# Patient Record
Sex: Male | Born: 1965 | Hispanic: No | Marital: Married | Smoking: Former smoker
Health system: Southern US, Community
[De-identification: ages and names within clinical notes are randomized; demographics above are authoritative.]

## PROBLEM LIST (undated history)

## (undated) DIAGNOSIS — K279 Peptic ulcer, site unspecified, unspecified as acute or chronic, without hemorrhage or perforation: Secondary | ICD-10-CM

## (undated) DIAGNOSIS — L600 Ingrowing nail: Secondary | ICD-10-CM

## (undated) DIAGNOSIS — B9681 Helicobacter pylori [H. pylori] as the cause of diseases classified elsewhere: Secondary | ICD-10-CM

## (undated) DIAGNOSIS — S335XXA Sprain of ligaments of lumbar spine, initial encounter: Secondary | ICD-10-CM

## (undated) DIAGNOSIS — K219 Gastro-esophageal reflux disease without esophagitis: Secondary | ICD-10-CM

## (undated) DIAGNOSIS — S42009A Fracture of unspecified part of unspecified clavicle, initial encounter for closed fracture: Secondary | ICD-10-CM

## (undated) DIAGNOSIS — R42 Dizziness and giddiness: Secondary | ICD-10-CM

## (undated) HISTORY — DX: Ingrowing nail: L60.0

## (undated) HISTORY — DX: Helicobacter pylori (H. pylori) as the cause of diseases classified elsewhere: B96.81

## (undated) HISTORY — DX: Sprain of ligaments of lumbar spine, initial encounter: S33.5XXA

## (undated) HISTORY — DX: Gastro-esophageal reflux disease without esophagitis: K21.9

## (undated) HISTORY — DX: Dizziness and giddiness: R42

## (undated) HISTORY — DX: Fracture of unspecified part of unspecified clavicle, initial encounter for closed fracture: S42.009A

## (undated) HISTORY — DX: Peptic ulcer, site unspecified, unspecified as acute or chronic, without hemorrhage or perforation: K27.9

---

## 1999-06-14 ENCOUNTER — Encounter: Admission: RE | Admit: 1999-06-14 | Discharge: 1999-06-14 | Payer: Self-pay | Admitting: Family Medicine

## 1999-06-20 ENCOUNTER — Encounter: Admission: RE | Admit: 1999-06-20 | Discharge: 1999-06-20 | Payer: Self-pay | Admitting: Family Medicine

## 1999-07-16 ENCOUNTER — Encounter: Admission: RE | Admit: 1999-07-16 | Discharge: 1999-07-16 | Payer: Self-pay | Admitting: Sports Medicine

## 2000-08-25 ENCOUNTER — Encounter: Admission: RE | Admit: 2000-08-25 | Discharge: 2000-08-25 | Payer: Self-pay | Admitting: Family Medicine

## 2000-09-15 ENCOUNTER — Encounter: Admission: RE | Admit: 2000-09-15 | Discharge: 2000-09-15 | Payer: Self-pay | Admitting: Family Medicine

## 2000-10-07 ENCOUNTER — Encounter: Admission: RE | Admit: 2000-10-07 | Discharge: 2000-10-07 | Payer: Self-pay | Admitting: *Deleted

## 2000-10-07 ENCOUNTER — Encounter: Admission: RE | Admit: 2000-10-07 | Discharge: 2000-10-07 | Payer: Self-pay | Admitting: Family Medicine

## 2000-10-14 ENCOUNTER — Encounter: Admission: RE | Admit: 2000-10-14 | Discharge: 2000-10-14 | Payer: Self-pay | Admitting: Family Medicine

## 2001-01-03 ENCOUNTER — Emergency Department (HOSPITAL_COMMUNITY): Admission: EM | Admit: 2001-01-03 | Discharge: 2001-01-03 | Payer: Self-pay

## 2001-01-14 ENCOUNTER — Encounter: Admission: RE | Admit: 2001-01-14 | Discharge: 2001-01-14 | Payer: Self-pay | Admitting: Family Medicine

## 2001-01-19 ENCOUNTER — Encounter: Admission: RE | Admit: 2001-01-19 | Discharge: 2001-02-19 | Payer: Self-pay | Admitting: Sports Medicine

## 2001-01-27 ENCOUNTER — Encounter: Admission: RE | Admit: 2001-01-27 | Discharge: 2001-01-27 | Payer: Self-pay | Admitting: Family Medicine

## 2001-02-19 ENCOUNTER — Encounter: Admission: RE | Admit: 2001-02-19 | Discharge: 2001-02-19 | Payer: Self-pay | Admitting: Family Medicine

## 2001-05-25 ENCOUNTER — Encounter: Admission: RE | Admit: 2001-05-25 | Discharge: 2001-05-25 | Payer: Self-pay | Admitting: Family Medicine

## 2001-05-28 ENCOUNTER — Encounter: Admission: RE | Admit: 2001-05-28 | Discharge: 2001-05-28 | Payer: Self-pay | Admitting: Sports Medicine

## 2001-05-28 ENCOUNTER — Encounter: Payer: Self-pay | Admitting: Sports Medicine

## 2001-07-20 ENCOUNTER — Encounter: Payer: Self-pay | Admitting: Family Medicine

## 2001-07-20 ENCOUNTER — Ambulatory Visit (HOSPITAL_COMMUNITY): Admission: RE | Admit: 2001-07-20 | Discharge: 2001-07-20 | Payer: Self-pay | Admitting: Family Medicine

## 2001-07-20 ENCOUNTER — Encounter: Admission: RE | Admit: 2001-07-20 | Discharge: 2001-07-20 | Payer: Self-pay | Admitting: Family Medicine

## 2001-08-10 ENCOUNTER — Encounter: Admission: RE | Admit: 2001-08-10 | Discharge: 2001-08-10 | Payer: Self-pay | Admitting: Family Medicine

## 2001-11-14 ENCOUNTER — Encounter: Payer: Self-pay | Admitting: Emergency Medicine

## 2001-11-14 ENCOUNTER — Emergency Department (HOSPITAL_COMMUNITY): Admission: EM | Admit: 2001-11-14 | Discharge: 2001-11-14 | Payer: Self-pay | Admitting: Emergency Medicine

## 2001-12-01 DIAGNOSIS — S42009A Fracture of unspecified part of unspecified clavicle, initial encounter for closed fracture: Secondary | ICD-10-CM

## 2001-12-01 HISTORY — DX: Fracture of unspecified part of unspecified clavicle, initial encounter for closed fracture: S42.009A

## 2001-12-14 ENCOUNTER — Encounter: Admission: RE | Admit: 2001-12-14 | Discharge: 2002-03-14 | Payer: Self-pay | Admitting: Sports Medicine

## 2002-02-02 ENCOUNTER — Encounter: Admission: RE | Admit: 2002-02-02 | Discharge: 2002-02-02 | Payer: Self-pay | Admitting: Family Medicine

## 2002-02-16 ENCOUNTER — Encounter: Admission: RE | Admit: 2002-02-16 | Discharge: 2002-02-16 | Payer: Self-pay | Admitting: Family Medicine

## 2002-05-12 ENCOUNTER — Encounter: Admission: RE | Admit: 2002-05-12 | Discharge: 2002-05-12 | Payer: Self-pay | Admitting: Family Medicine

## 2002-11-27 ENCOUNTER — Emergency Department (HOSPITAL_COMMUNITY): Admission: EM | Admit: 2002-11-27 | Discharge: 2002-11-27 | Payer: Self-pay | Admitting: Emergency Medicine

## 2003-02-16 ENCOUNTER — Encounter: Admission: RE | Admit: 2003-02-16 | Discharge: 2003-02-16 | Payer: Self-pay | Admitting: Family Medicine

## 2003-12-15 ENCOUNTER — Encounter: Admission: RE | Admit: 2003-12-15 | Discharge: 2003-12-15 | Payer: Self-pay | Admitting: Family Medicine

## 2007-01-28 DIAGNOSIS — N400 Enlarged prostate without lower urinary tract symptoms: Secondary | ICD-10-CM | POA: Insufficient documentation

## 2007-04-09 ENCOUNTER — Ambulatory Visit: Payer: Self-pay | Admitting: Family Medicine

## 2007-04-09 ENCOUNTER — Encounter (INDEPENDENT_AMBULATORY_CARE_PROVIDER_SITE_OTHER): Payer: Self-pay | Admitting: Family Medicine

## 2007-04-09 DIAGNOSIS — L600 Ingrowing nail: Secondary | ICD-10-CM | POA: Insufficient documentation

## 2007-04-09 HISTORY — DX: Ingrowing nail: L60.0

## 2008-04-06 ENCOUNTER — Ambulatory Visit: Payer: Self-pay | Admitting: Family Medicine

## 2008-04-06 ENCOUNTER — Encounter (INDEPENDENT_AMBULATORY_CARE_PROVIDER_SITE_OTHER): Payer: Self-pay | Admitting: Family Medicine

## 2008-04-06 DIAGNOSIS — N400 Enlarged prostate without lower urinary tract symptoms: Secondary | ICD-10-CM

## 2008-04-06 DIAGNOSIS — K5900 Constipation, unspecified: Secondary | ICD-10-CM | POA: Insufficient documentation

## 2008-04-06 DIAGNOSIS — R3919 Other difficulties with micturition: Secondary | ICD-10-CM

## 2008-04-06 DIAGNOSIS — K5901 Slow transit constipation: Secondary | ICD-10-CM

## 2008-04-06 LAB — CONVERTED CEMR LAB
BUN: 12 mg/dL (ref 6–23)
Bilirubin Urine: NEGATIVE
CO2: 26 meq/L (ref 19–32)
Calcium: 9 mg/dL (ref 8.4–10.5)
Chloride: 103 meq/L (ref 96–112)
Creatinine, Ser: 0.89 mg/dL (ref 0.40–1.50)
Glucose, Bld: 136 mg/dL — ABNORMAL HIGH (ref 70–99)
Glucose, Urine, Semiquant: NEGATIVE
Ketones, urine, test strip: NEGATIVE
Nitrite: NEGATIVE
PSA: 0.8 ng/mL (ref 0.10–4.00)
Potassium: 4 meq/L (ref 3.5–5.3)
Protein, U semiquant: NEGATIVE
Sodium: 141 meq/L (ref 135–145)
Specific Gravity, Urine: 1.025
TSH: 3.168 microintl units/mL (ref 0.350–5.50)
Urobilinogen, UA: 0.2
WBC Urine, dipstick: NEGATIVE
pH: 5.5

## 2008-04-13 ENCOUNTER — Ambulatory Visit: Payer: Self-pay | Admitting: Family Medicine

## 2008-04-20 ENCOUNTER — Ambulatory Visit: Payer: Self-pay | Admitting: Family Medicine

## 2008-04-20 ENCOUNTER — Encounter (INDEPENDENT_AMBULATORY_CARE_PROVIDER_SITE_OTHER): Payer: Self-pay | Admitting: Family Medicine

## 2008-04-23 ENCOUNTER — Encounter (INDEPENDENT_AMBULATORY_CARE_PROVIDER_SITE_OTHER): Payer: Self-pay | Admitting: Family Medicine

## 2008-04-23 LAB — CONVERTED CEMR LAB
ALT: 46 units/L (ref 0–53)
Albumin: 4.2 g/dL (ref 3.5–5.2)
Bilirubin, Direct: 0.1 mg/dL (ref 0.0–0.3)
Cholesterol: 214 mg/dL — ABNORMAL HIGH (ref 0–200)
Total CHOL/HDL Ratio: 5.6
VLDL: 20 mg/dL (ref 0–40)

## 2008-12-27 ENCOUNTER — Telehealth (INDEPENDENT_AMBULATORY_CARE_PROVIDER_SITE_OTHER): Payer: Self-pay | Admitting: Family Medicine

## 2009-02-16 ENCOUNTER — Ambulatory Visit: Payer: Self-pay | Admitting: Family Medicine

## 2009-02-16 DIAGNOSIS — R5381 Other malaise: Secondary | ICD-10-CM

## 2009-02-16 DIAGNOSIS — R5383 Other fatigue: Secondary | ICD-10-CM

## 2009-02-16 DIAGNOSIS — S335XXA Sprain of ligaments of lumbar spine, initial encounter: Secondary | ICD-10-CM

## 2009-02-16 HISTORY — DX: Sprain of ligaments of lumbar spine, initial encounter: S33.5XXA

## 2009-02-19 ENCOUNTER — Telehealth: Payer: Self-pay | Admitting: *Deleted

## 2009-02-27 ENCOUNTER — Encounter: Admission: RE | Admit: 2009-02-27 | Discharge: 2009-03-28 | Payer: Self-pay | Admitting: Family Medicine

## 2009-03-29 ENCOUNTER — Encounter (INDEPENDENT_AMBULATORY_CARE_PROVIDER_SITE_OTHER): Payer: Self-pay | Admitting: Family Medicine

## 2010-04-16 ENCOUNTER — Telehealth: Payer: Self-pay | Admitting: Family Medicine

## 2010-04-16 ENCOUNTER — Encounter: Payer: Self-pay | Admitting: Family Medicine

## 2010-04-16 ENCOUNTER — Ambulatory Visit: Payer: Self-pay | Admitting: Family Medicine

## 2010-04-16 DIAGNOSIS — R300 Dysuria: Secondary | ICD-10-CM

## 2010-04-16 LAB — CONVERTED CEMR LAB
Bilirubin Urine: NEGATIVE
Glucose, Urine, Semiquant: NEGATIVE
PSA: 1.08 ng/mL (ref 0.10–4.00)
Protein, U semiquant: NEGATIVE
Specific Gravity, Urine: 1.01
pH: 5.5

## 2010-04-22 ENCOUNTER — Telehealth: Payer: Self-pay | Admitting: Family Medicine

## 2010-07-23 ENCOUNTER — Encounter: Payer: Self-pay | Admitting: Family Medicine

## 2010-07-23 ENCOUNTER — Ambulatory Visit: Payer: Self-pay | Admitting: Family Medicine

## 2010-08-08 ENCOUNTER — Encounter: Payer: Self-pay | Admitting: Family Medicine

## 2010-08-08 ENCOUNTER — Ambulatory Visit: Payer: Self-pay | Admitting: Family Medicine

## 2010-08-08 DIAGNOSIS — R42 Dizziness and giddiness: Secondary | ICD-10-CM | POA: Insufficient documentation

## 2010-08-08 DIAGNOSIS — E7800 Pure hypercholesterolemia, unspecified: Secondary | ICD-10-CM

## 2010-08-08 LAB — CONVERTED CEMR LAB
ALT: 178 units/L — ABNORMAL HIGH (ref 0–53)
AST: 84 units/L — ABNORMAL HIGH (ref 0–37)
Albumin: 4.4 g/dL (ref 3.5–5.2)
Alkaline Phosphatase: 66 units/L (ref 39–117)
Calcium: 9 mg/dL (ref 8.4–10.5)
Chloride: 103 meq/L (ref 96–112)
Platelets: 196 10*3/uL (ref 150–400)
Potassium: 4.1 meq/L (ref 3.5–5.3)
RDW: 12.4 % (ref 11.5–15.5)
Sodium: 139 meq/L (ref 135–145)

## 2010-08-27 ENCOUNTER — Ambulatory Visit: Payer: Self-pay | Admitting: Family Medicine

## 2010-09-09 ENCOUNTER — Ambulatory Visit: Payer: Self-pay | Admitting: Family Medicine

## 2010-09-09 ENCOUNTER — Encounter: Payer: Self-pay | Admitting: Family Medicine

## 2010-09-09 DIAGNOSIS — R10817 Generalized abdominal tenderness: Secondary | ICD-10-CM | POA: Insufficient documentation

## 2010-09-09 LAB — CONVERTED CEMR LAB
Albumin: 4.4 g/dL (ref 3.5–5.2)
Alkaline Phosphatase: 58 units/L (ref 39–117)
BUN: 10 mg/dL (ref 6–23)
CO2: 30 meq/L (ref 19–32)
Glucose, Bld: 98 mg/dL (ref 70–99)
Nitrite: NEGATIVE
Potassium: 3.9 meq/L (ref 3.5–5.3)
Protein, U semiquant: NEGATIVE
Specific Gravity, Urine: 1.02
Total Bilirubin: 0.6 mg/dL (ref 0.3–1.2)
Total Protein: 7.1 g/dL (ref 6.0–8.3)
Urobilinogen, UA: 0.2
pH: 6

## 2010-09-13 ENCOUNTER — Encounter: Payer: Self-pay | Admitting: Family Medicine

## 2010-09-13 ENCOUNTER — Ambulatory Visit: Payer: Self-pay | Admitting: Family Medicine

## 2011-01-02 NOTE — Assessment & Plan Note (Signed)
Summary: pain with urination/Michael Fowler   Vital Signs:  Patient profile:   45 year old male Height:      70 inches Weight:      159.9 pounds BMI:     23.03 Temp:     98.1 degrees F oral Pulse rate:   51 / minute BP sitting:   135 / 82  (left arm) Cuff size:   regular  Vitals Entered By: Garen Grams LPN (Apr 16, 2010 2:51 PM) CC: dysuria x 2 weeks Is Patient Diabetic? No Pain Assessment Patient in pain? no        Primary Care Provider:  Alvia Grove DO  CC:  dysuria x 2 weeks.  History of Present Illness: Pt states that he is having some dyysuria for the last ten days.  Pt has hx of BPH states that it feels a little like that at the beginning.  Pt began taking his flomax again after some time off that was less than a month and today it seems to be improving.  Pt states the pain seems to be more at the meatus, no discharge no blood, strong stream at first but then seems to decrease to a slow dribble by the end.  Pt states that he has been a little uncomfortable as well with defecationi of late stating that it seems to be causing (perineum) pain.  Pt states he is sexually active with his wife but denies any other partners or sexually transmitted diseases.  Pt does not remember when his prostate was checked last.   No recent travel hx, no fever, chills, nausea, vomiting, diarrhea but maybe a little constipation.   Habits & Providers  Alcohol-Tobacco-Diet     Tobacco Status: never  Current Medications (verified): 1)  Flomax 0.4 Mg  Cp24 (Tamsulosin Hcl) .... One Tablet Daily For Benign Prostatic Hypertrophy 2)  Benefiber   Powd (Wheat Dextrin) .... Take As Directed To Increase Fiber Intake.  Disp 1 Tub 3)  Miralax   Powd (Polyethylene Glycol 3350) .Marland Kitchen.. 17g Daily Up To Twice Daily For Constipation.  Disp 170g 4)  Naprosyn 500 Mg Tabs (Naproxen) .Marland Kitchen.. 1 Two Times A Day As Needed Pain 5)  Cyclobenzaprine Hcl 10 Mg  Tabs (Cyclobenzaprine Hcl) .... 1/2 To 1 Tablet By Mouth 2 Times Daily As  Needed For Back Pain. Caution - Can Make You Sleepy 6)  Ciprofloxacin Hcl 500 Mg Tabs (Ciprofloxacin Hcl) .... Take 1 Tab By Mouth Two Times A Day For Three Days  Allergies (verified): No Known Drug Allergies  Past History:  Past medical, surgical, family and social histories (including risk factors) reviewed, and no changes noted (except as noted below).  Past Medical History: Reviewed history from 04/13/2008 and no changes required. single testicle BPH - PSA 0.07 Apr 2008  Past Surgical History: Reviewed history from 04/13/2008 and no changes required. none  Family History: Reviewed history from 04/13/2008 and no changes required. father - died at 64 and healthy mother - healthy in her 72s brothers and sisters   Social History: Reviewed history from 04/13/2008 and no changes required. Somali.  Lives  with wife and 6 children. Works at [employer].  Muslim.  Prior tobacco abuse.  Review of Systems       denies fever, chills, nausea, vomiting, diarrhea   Physical Exam  General:  Well-developed,well-nourished,in no acute distress; alert,appropriate and cooperative throughout examination Mouth:  MMM Lungs:  CTAB Heart:  Normal rate and regular rhythm. S1 and S2 normal without gallop, murmur,  click, rub or other extra sounds. Abdomen:  Bowel sounds positive,abdomen soft and non-tender without masses, organomegaly or hernias noted. Genitalia:  no penile discharge, no lesions, uncircumcised.  Prostate:  enlarged symmetrically no nodules, tender to palpation    Impression & Recommendations:  Problem # 1:  BENIGN PROSTATIC HYPERTROPHY, WITH OBSTRUCTION (ICD-600.01) will get PSA.  ON exam enlarged but no nodules.   Orders: PSA-[facility] ([id number])  Problem # 2:  DYSURIA (ICD-788.1) Will treat due to dysuria, will treat with cipro, think early enough so can due short course.  Pt also stating some improvement already.  Can f/u in 2 weeks.  At that time consider increasing flomax if  BPH symptoms or longer course of cipro and urology referral.  His updated medication list for this problem includes:    Ciprofloxacin Hcl 500 Mg Tabs (Ciprofloxacin hcl) .Marland Kitchen... Take 1 tab by mouth two times a day for three days  Orders: Urinalysis-[facility] (00000) [facility]- Est Level  3 (23557)  Complete Medication List: 1)  Flomax 0.4 Mg Cp24 (Tamsulosin hcl) .... One tablet daily for benign prostatic hypertrophy 2)  Benefiber Powd (Wheat dextrin) .... Take as directed to increase fiber intake.  disp 1 tub 3)  Miralax Powd (Polyethylene glycol 3350) .Marland Kitchen.. 17g daily up to twice daily for constipation.  disp 170g 4)  Naprosyn 500 Mg Tabs (Naproxen) .Marland Kitchen.. 1 two times a day as needed pain 5)  Cyclobenzaprine Hcl 10 Mg Tabs (Cyclobenzaprine hcl) .... 1/2 to 1 tablet by mouth 2 times daily as needed for back pain. caution - can make you sleepy 6)  Ciprofloxacin Hcl 500 Mg Tabs (Ciprofloxacin hcl) .... Take 1 tab by mouth two times a day for three days  Patient Instructions: 1)  Very nice to meet you. 2)  I think your prostate is getting a little larger.  I want you to continue taking the flomax as prescribed.  If we need to in 2 weeks when you see your Dr. we can maybe double the dose. 3)  I will also give you a prescription for cipro, a antibiotic that you can take for three days if the pain does not clear up in the next day 4)  Come see your dr. in 2 weeks, if not better we will increase your flomax and if still not better than may need to see a urologist. Prescriptions: FLOMAX 0.4 MG  CP24 (TAMSULOSIN HCL) One tablet daily for benign prostatic hypertrophy  #32 x 5   Entered and Authorized by:   Antoine Primas DO   Signed by:   Antoine Primas DO on 04/16/2010   Method used:   Electronically to        [facility] [id number] (retail)       [address] [place], [place]  [address]       Ph: [phone]       Fax: [phone]   RxID:   [id number] CIPROFLOXACIN HCL 500  MG TABS (CIPROFLOXACIN HCL) take 1 tab by mouth two times a day for three days  #6 x 0   Entered and Authorized by:   Antoine Primas DO   Signed by:   Antoine Primas DO on 04/16/2010   Method used:   Electronically to        [facility] [address] (retail)       [address]  [place], [place]  [address]       Ph: [phone]       Fax: [phone]   RxID:   [id number]   Laboratory Results   Urine Tests  Date/Time Received: Apr 16, 2010 2:42 PM  Date/Time Reported: Apr 16, 2010 2:52 PM   Routine Urinalysis   Color: yellow Appearance: Clear Glucose: negative   (Normal Range: Negative) Bilirubin: negative   (Normal Range: Negative) Ketone: negative   (Normal Range: Negative) Spec. Gravity: 1.010   (Normal Range: 1.003-1.035) Blood: negative   (Normal Range: Negative) pH: 5.5   (Normal Range: 5.0-8.0) Protein: negative   (Normal Range: Negative) Urobilinogen: 0.2   (Normal Range: 0-1) Nitrite: negative   (Normal Range: Negative) Leukocyte Esterace: negative   (Normal Range: Negative)    Comments: ...............test performed by......Marland KitchenBonnie A. [place], MLS (ASCP)cm

## 2011-01-02 NOTE — Miscellaneous (Signed)
Summary: Procedures Consent  Procedures Consent   Imported By: De Nurse 09/18/2010 14:53:31  _____________________________________________________________________  External Attachment:    Type:   Image     Comment:   External Document

## 2011-01-02 NOTE — Assessment & Plan Note (Signed)
Summary: MEET NEW DR/KH   Vital Signs:  Patient profile:   45 year old male Height:      70 inches Weight:      156 pounds BMI:     22.46 BSA:     1.88 Temp:     98.4 degrees F Pulse rate:   79 / minute BP sitting:   124 / 80  Vitals Entered By: Jone Baseman CMA (August 08, 2010 1:37 PM) CC: dizzy and fatigued x 1 week Is Patient Diabetic? No Pain Assessment Patient in pain? no        Primary Care Provider:  Alvia Grove DO  CC:  dizzy and fatigued x 1 week.  History of Present Illness: 45  yo male with c/o of fatigue and 1  episode of dizziness. Recently returned from 8  week trip to [place].  Admitted to very poor sleep during the trip and difficulty adjusting to time change. Had one episode of dizziness yesterday.  He stood up from bed after a several hour nap and noticed some lightheadness and felt dizzy. Continued to take a shower and symptoms completely resolved, lasting only 3--5  min total.  No more episodes since, no prior episodes of the same. Currently feels fine. No syncope, no headaches, no focal neuro changes noted by pt.  Habits & Providers  Alcohol-Tobacco-Diet     Tobacco Status: never     Year Quit: 2006     Passive Smoke Exposure: no  Current Problems (verified): 1)  Fatigue, Acute  (ICD-780.79) 2)  Dizziness  (ICD-780.4) 3)  Hypercholesterolemia  (ICD-272.0) 4)  Dysuria  (ICD-788.1) 5)  Back Strain, Lumbar  (ICD-847.2) 6)  Weakness  (ICD-780.79) 7)  Physical Examination  (ICD-V70.0) 8)  Benign Prostatic Hypertrophy, With Obstruction  (ICD-600.01) 9)  Constipation, Slow Transit  (ICD-564.01) 10)  Other Abnormality of Urination  (ICD-788.69) 11)  Screening For Lipoid Disorders  (ICD-V77.91) 12)  Ingrown Toenail  (ICD-703.0) 13)  Bph  (ICD-600)  Current Medications (verified): 1)  Flomax 0.4 Mg  Cp24 (Tamsulosin Hcl) .... One Tablet Daily For Benign Prostatic Hypertrophy 2)  Benefiber   Powd (Wheat Dextrin) .... Take As Directed To  Increase Fiber Intake.  Disp 1 Tub 3)  Miralax   Powd (Polyethylene Glycol 3350) .Marland Kitchen.. 17g Daily Up To Twice Daily For Constipation.  Disp 170g 4)  Doxycycline Hyclate 100 Mg Caps (Doxycycline Hyclate) .... One Tab By Mouth Two Times A Day X 7 Days  Allergies (verified): No Known Drug Allergies  Past History:  Past Medical History: Last updated: 04/26/2008 single testicle BPH - PSA 0.07 Apr 2008  Past Surgical History: Last updated: 04-26-08 none  Family History: Last updated: 2008-04-26 father - died at 49 and healthy mother - healthy in her 73s brothers and sisters   Social History: Last updated: 08/08/2010 From [place].  Lives  with wife and 6 children. Works at [employer].  Muslim.  Prior tobacco abuse.  Risk Factors: Caffeine Use: 1 (04/06/2008) Exercise: no (04/06/2008)  Risk Factors: Smoking Status: never (08/08/2010) Passive Smoke Exposure: no (08/08/2010)  Social History: Reviewed history from 04/26/2008 and no changes required. From [place].  Lives  with wife and 6 children. Works at [employer].  Muslim.  Prior tobacco abuse.  Review of Systems  The patient denies anorexia, fever, weight loss, weight gain, vision loss, decreased hearing, hoarseness, chest pain, syncope, dyspnea on exertion, peripheral edema, prolonged cough, headaches, hemoptysis, abdominal pain, melena, hematochezia, severe indigestion/heartburn, hematuria, incontinence, genital  sores, muscle weakness, suspicious skin lesions, transient blindness, difficulty walking, depression, unusual weight change, abnormal bleeding, enlarged lymph nodes, angioedema, breast masses, and testicular masses.    Physical Exam  General:  vs reviewed, alert, well-developed, well-nourished, and well-hydrated.   Ears:  External ear exam shows no significant lesions or deformities.  Otoscopic examination reveals clear canals, tympanic membranes are intact bilaterally without bulging, retraction, inflammation or discharge.  Hearing is grossly normal bilaterally. Lungs:  normal respiratory effort, normal breath sounds, no crackles, and no wheezes.   Heart:  Normal rate and regular rhythm. S1 and S2 normal without gallop, murmur, click, rub or other extra sounds. Abdomen:  Bowel sounds positive,abdomen soft and non-tender without masses, organomegaly or hernias noted. Neurologic:  No cranial nerve deficits noted. Station and gait are normal. Plantar reflexes are down-going bilaterally. DTRs are symmetrical throughout. Sensory, motor and coordinative functions appear intact. Skin:  turgor normal and color normal.     Impression & Recommendations:  Problem # 1:  FATIGUE, ACUTE (ICD-780.79) Assessment New likely due to recent travel and frequent time changes.  Pt still not on nomal schedule.  Napping thruout the day and unable to sleep at night.  Encouraged to return to previous sleeping schedule.  If unable, may consider a short course of Sonata.  Pt will call if he needs meds.  Problem # 2:  DIZZINESS (ICD-780.4) One episode yesterday.  No syncope.  If repeat episodes occur, consider further eval. Orders: [facility]- Est Level  3 ([address])  Problem # 3:  HYPERCHOLESTEROLEMIA (ICD-272.0)  Orders:LDL today Comp Met-[facility] ([id number]) Direct LDL-[facility] ([id number])  Complete Medication List: 1)  Flomax 0.4 Mg Cp24 (Tamsulosin hcl) .... One tablet daily for benign prostatic hypertrophy 2)  Benefiber Powd (Wheat dextrin) .... Take as directed to increase fiber intake.  disp 1 tub 3)  Miralax Powd (Polyethylene glycol 3350) .Marland Kitchen.. 17g daily up to twice daily for constipation.  disp 170g 4)  Doxycycline Hyclate 100 Mg Caps (Doxycycline hyclate) .... One tab by mouth two times a day x 7 days  Other Orders: CBC-[facility] (13086)  Patient Instructions: 1)  Great to see you today. 2)  I will check some blood work today. 3)  If you have any more dizziness, please let me know.

## 2011-01-02 NOTE — Progress Notes (Signed)
Summary: results  Phone Note Call from Patient Call back at Work Phone [phone]   Caller: Patient Summary of Call: has a question about test results and a shot Initial call taken by: De Nurse,  Apr 22, 2010 2:40 PM  Follow-up for Phone Call        line remains busy  Follow-up by: Golden Circle RN,  Apr 22, 2010 3:03 PM  Additional Follow-up for Phone Call Additional follow up Details #1::        told him psa was fine. he & family are going to [place]. asked him to call the health dept . they have lists of recommended shots to get prior to travel. he is leaving end of June. told him to call & make appt right away as some are not effective immediately but require time to build antibodies. states he is off tomorrow & will call then Additional Follow-up by: Golden Circle RN,  Apr 22, 2010 3:47 PM

## 2011-01-02 NOTE — Assessment & Plan Note (Signed)
Summary: ingrown toe,df   Vital Signs:  Patient profile:   45 year old male Height:      70 inches Weight:      155 pounds BMI:     22.32 BSA:     1.87 Temp:     98.7 degrees F Pulse rate:   66 / minute BP sitting:   116 / 85  Vitals Entered By: Jone Baseman CMA (July 23, 2010 10:42 AM) CC: ingrown toe- left foot Is Patient Diabetic? No Pain Assessment Patient in pain? yes     Location: feet Intensity: 5   Primary Care Provider:  Alvia Grove DO  CC:  ingrown toe- left foot.  History of Present Illness: 1) Ingrown toenails: Bilateral ingrown great toenails - left worse than right. Longstanding history of this. Has had partial nail avulsion bilaterally without phenolization in the past. Left toe is now swollen and a pocket of pus is present where the nail is embedded in the skin.  Denies fever, chills, numbness in toe, history of diabetes,  Med rec is as below except for doxycycline (new med today)   Habits & Providers  Alcohol-Tobacco-Diet     Tobacco Status: never  Current Medications (verified): 1)  Flomax 0.4 Mg  Cp24 (Tamsulosin Hcl) .... One Tablet Daily For Benign Prostatic Hypertrophy 2)  Benefiber   Powd (Wheat Dextrin) .... Take As Directed To Increase Fiber Intake.  Disp 1 Tub 3)  Miralax   Powd (Polyethylene Glycol 3350) .Marland Kitchen.. 17g Daily Up To Twice Daily For Constipation.  Disp 170g 4)  Doxycycline Hyclate 100 Mg Caps (Doxycycline Hyclate) .... One Tab By Mouth Two Times A Day X 7 Days  Allergies (verified): No Known Drug Allergies  Physical Exam  General:  Well-developed,well-nourished,in no acute distress; alert,appropriate and cooperative throughout examination Skin:  bilateral ingrown toenails at both lateral and medial edges L > R Left great toe with paronychia laterally with small pocket of pus.    Impression & Recommendations:  Problem # 1:  INGROWN TOENAIL (ICD-703.0) Assessment Deteriorated  Partial nail removal as above. Plan  for partial great toe nail removal on right with Dr. Janalyn Harder on Thursday afternoon. Follow up with PCP. Tolerated procedure well. Doxycycline for paronychia as below.   The following medications were removed from the medication list:    Ciprofloxacin Hcl 500 Mg Tabs (Ciprofloxacin hcl) .Marland Kitchen... Take 1 tab by mouth two times a day for three days His updated medication list for this problem includes:    Doxycycline Hyclate 100 Mg Caps (Doxycycline hyclate) ..... One tab by mouth two times a day x 7 days  Orders: Nail excision, partial or complete, permanent (11750)  Complete Medication List: 1)  Flomax 0.4 Mg Cp24 (Tamsulosin hcl) .... One tablet daily for benign prostatic hypertrophy 2)  Benefiber Powd (Wheat dextrin) .... Take as directed to increase fiber intake.  disp 1 tub 3)  Miralax Powd (Polyethylene glycol 3350) .Marland Kitchen.. 17g daily up to twice daily for constipation.  disp 170g 4)  Doxycycline Hyclate 100 Mg Caps (Doxycycline hyclate) .... One tab by mouth two times a day x 7 days  Patient Instructions: 1)  Take care of your toe as instructed by your nurse.  2)  Make an appointment to be seen by Dr. Janalyn Harder on Thursday afternoon to have your toenail removed on your right foot 3)  Take your antibiotic as directed.  4)  You can take tylenol or ibuprofen for pain as needed.  5)  Make an  appointment 2 weeks from now to see YOUR NEW DOCTOR (DR. Gomez Cleverly) to discuss any other issues you are having.  Prescriptions: DOXYCYCLINE HYCLATE 100 MG CAPS (DOXYCYCLINE HYCLATE) one tab by mouth two times a day x 7 days  #14 x 0   Entered and Authorized by:   Bobby Rumpf  MD   Signed by:   Bobby Rumpf  MD on 07/23/2010   Method used:   Electronically to        [facility] [id number] (retail)       [address]       [place], [place]  [address]       Ph: [phone]       Fax: [phone]   RxID:   [id number]    Procedure Note Last Tetanus: Tdap (04/13/2008)  Nail  Treatment: Indication: paronychia and ingrown toenail  Consent signed: yes  Procedure # 1: nail bed removal w/chemical matrixectomy    Region: left great toe     Anesthesia: 1% lidocaine w/o epinephrine (10 cc)    Comment: Informed conset obtained and form signed. Time out performed. Area prep with betadine. Tourniquet applied. Nail elevator used first laterally then medially. Lateral and medial 1/5 of nail separated, and pulled using usual technique. Nail bed phenolization performed. Phenol properly disposed of. Sterile field maintained at all times untill end of procedure.  Wound dressing: neosporin and bulky gauze dressing Additional Instructions: Care per handout.

## 2011-01-02 NOTE — Progress Notes (Signed)
Summary: triage  Phone Note Call from Patient Call back at [phone]   Caller: Patient Summary of Call: Pt has pain when urinating. Initial call taken by: Clydell Hakim,  Apr 16, 2010 11:35 AM  Follow-up for Phone Call        started 10 days ago. thought it was his prostate so he got a refill on his med for that & it did not help. to be here at 1:30 for a work in. aware of wait. to drink lots of water. has aleve at [facility] [employer]. told him to take that (with food) Follow-up by: Golden Circle RN,  Apr 16, 2010 11:56 AM

## 2011-01-02 NOTE — Assessment & Plan Note (Signed)
Summary: F/U PER BURNHAM/KH   Vital Signs:  Patient profile:   45 year old male Height:      70 inches Weight:      155.5 pounds BMI:     22.39 Temp:     98.1 degrees F oral Pulse rate:   80 / minute BP sitting:   123 / 78  (left arm) Cuff size:   regular  Vitals Entered By: Garen Grams LPN (September 13, 2010 3:58 PM) CC: f/u toenail and abd pain Is Patient Diabetic? No Pain Assessment Patient in pain? no        Primary Care Provider:  Alvia Grove DO  CC:  f/u toenail and abd pain.  History of Present Illness: 1. Igrown toenail: ingrown right great toenail - left removed a few weeks ago with good relief in pain.  Pt has a longstanding history of this. Does admitt to a previous partial nail avulsion bilaterally without phenolization in the past. Did find relief from these procedure intially, but toenails re-grew in the same fashion.  Right toe is increasingly tender, not swollen, some redness.    Denies fever, chills, numbness in toe, no history of diabetes  2. F/u abd pain:  Finishing 14 day tx for H. pylori with almost complete relief of symptoms.  Nausea resolved, denies any more bloating, no postprandial symptoms.  Pain resolved.  Still has occasional acid reflux symptoms, but finds good relief with PPI.    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 One Tablet Daily For Benign Prostatic Hypertrophy 2)  Benefiber   Powd (Wheat Dextrin) .... Take As Directed To Increase Fiber Intake.  Disp 1 Tub 3)  Miralax   Powd (Polyethylene Glycol 3350) .Marland Kitchen.. 17g Daily Up To Twice Daily For Constipation.  Disp 170g 4)  Prilosec 20 Mg Cpdr (Omeprazole) .... Take 1 Pill By Mouth Two Times A Day For 14 Days 5)  Amoxicillin 500 Mg Caps (Amoxicillin) .... Take 1 Pill By Mouth Two Times A Day For 14 Days 6)  Clarithromycin 500 Mg Tabs (Clarithromycin) .... Take 1 Pill By Mouth Two Times A Day For 14 Days 7)  Doxycycline Hyclate 100 Mg Caps (Doxycycline Hyclate) .Marland Kitchen.. 1 Pill By Mouth Two Times A Day X 7 Days 8)  Vicodin 5-500 Mg Tabs (Hydrocodone-Acetaminophen) .Marland Kitchen.. 1 Pill By Mouth Q6 Hours As Needed For Pain in Foot  Allergies (verified): No Known Drug Allergies  Past History:  Past Medical History: Last updated: 04-24-2008 single testicle BPH - PSA 0.07 Apr 2008  Past Surgical History: Last updated: Apr 24, 2008 none  Family History: Last updated: 04-24-08 father - died at 37 and healthy mother - healthy in her 59s brothers and sisters   Social History: Last updated: 08/08/2010 From [place].  Lives  with wife and 6 children. Works at [employer].  Muslim.  Prior  tobacco abuse.  Risk Factors: Caffeine Use: 1 (04/06/2008) Exercise: no (04/06/2008)  Risk Factors: Smoking Status: never (09/13/2010) Passive Smoke Exposure: no (08/08/2010)  Social History: Reviewed history from 08/08/2010 and no changes required. From [place].  Lives  with wife and 6 children. Works at [employer].  Muslim.  Prior tobacco abuse.Smoking Status:  never  Review of Systems  The patient denies anorexia, fever, weight loss, weight gain, vision loss, decreased hearing, hoarseness, chest pain, syncope, dyspnea on exertion, peripheral edema, prolonged cough, headaches, hemoptysis, abdominal pain, melena, hematochezia, severe  indigestion/heartburn, hematuria, incontinence, genital sores, muscle weakness, suspicious skin lesions, transient blindness, difficulty walking, depression, unusual weight change, abnormal bleeding, enlarged lymph nodes, angioedema, breast masses, and testicular masses.    Physical Exam  General:  Vital signs reviewed. Well-developed, well-nourished patient in NAD.    Abdomen:  soft, non-tender, normal bowel sounds, and no distention.   Extremities:  R ingrown toenails at both lateral and medial edges, no swelling, mild redness, tender Left great toe s/p medial and later edge removal, no redness, no pus, not swollen, healed well. Additional Exam:  Right toe partial Nail excision/removal:  Indication:  ingrown toenail and tenderness  Consent: reviewed risks and benefits with patient, consent signed.  Procedure:: nail bed removal w/chemical matrixectomy    Region: right great toe     Anesthesia: 1% lidocaine w/o epinephrine (10 cc)    Comment:  Time out performed. Area cleaned with alcohol pads and area prep with betadine x 3. Tourniquet applied. Nail elevator used first laterally then medially. Lateral and medial 1/5 of nail separated, and pulled using usual technique. Nail bed phenolization performed. Phenol properly disposed of. Sterile field maintained at all times untill end of procedure.  Wound dressing: neosporin and bulky gauze dressing Additional Instructions: Care per handout. Patient tolerated procedure well and had no acute complications. Dr. Wallene Huh present and participated in procedure.     Impression & Recommendations:  Problem # 1:  INGROWN TOENAIL (ICD-703.0) Assessment Deteriorated Partial nail removal as above. Tolerated procedure well. Doxycycline for 7 days.  Vicodin for pain not relieved by OTC meds.  see pt instructions   His updated medication list for this problem includes:    Amoxicillin 500 Mg Caps (Amoxicillin) .Marland Kitchen... Take 1 pill by mouth two times a day for  14 days    Clarithromycin 500 Mg Tabs (Clarithromycin) .Marland Kitchen... Take 1 pill by mouth two times a day for 14 days    Doxycycline Hyclate 100 Mg Caps (Doxycycline hyclate) .Marland Kitchen... 1 pill by mouth two times a day x 7 days  Orders: Nail excision, partial or complete, permanent (11750) [facility]- Est Level  3 (04540)  Problem # 2:  ABDOMINAL TENDERNESS, GENERALIZED (ICD-789.67) Assessment: Improved Much improved since beginning tx for H Pylori. plan to complete 14 day course for tx of h. pylori.   Complete Medication List: 1)  Flomax 0.4 Mg Cp24 (Tamsulosin hcl) .... One tablet daily for benign prostatic hypertrophy 2)  Benefiber Powd (Wheat dextrin) .... Take as directed to increase fiber intake.  disp 1 tub 3)  Miralax Powd (Polyethylene glycol 3350) .Marland Kitchen.. 17g daily up to twice daily for constipation.  disp 170g 4)  Prilosec 20 Mg Cpdr (Omeprazole) .... Take 1 pill by mouth two times a day for 14 days 5)  Amoxicillin 500 Mg Caps (Amoxicillin) .... Take 1 pill by mouth two times a day for 14 days 6)  Clarithromycin 500 Mg Tabs (Clarithromycin) .... Take 1 pill by mouth two  times a day for 14 days 7)  Doxycycline Hyclate 100 Mg Caps (Doxycycline hyclate) .Marland Kitchen.. 1 pill by mouth two times a day x 7 days 8)  Vicodin 5-500 Mg Tabs (Hydrocodone-acetaminophen) .Marland Kitchen.. 1 pill by mouth q6 hours as needed for pain in foot  Patient Instructions: 1)  Take care of your toe as instructed by your nurse.  2)  Take your antibiotic as directed.  3)  You can take tylenol or ibuprofen for pain as needed.  4)  Please schedule a follow-up appointment as needed .  Prescriptions: VICODIN 5-500 MG TABS (HYDROCODONE-ACETAMINOPHEN) 1 pill by mouth Q6 hours as needed for pain in foot  #30 x 0   Entered and Authorized by:   Alvia Grove DO   Signed by:   Alvia Grove DO on 09/14/2010   Method used:   Handwritten   RxID:   [id number] DOXYCYCLINE HYCLATE 100 MG CAPS (DOXYCYCLINE HYCLATE) 1 pill by mouth two times a day x 7  days  #14 x 0   Entered and Authorized by:   Alvia Grove DO   Signed by:   Alvia Grove DO on 09/14/2010   Method used:   Handwritten   RxID:   [id number]

## 2011-01-02 NOTE — Assessment & Plan Note (Signed)
Summary: ear ache,df   Vital Signs:  Patient profile:   45 year old male Weight:      156.3 pounds Temp:     98 degrees F oral Pulse rate:   79 / minute BP sitting:   128 / 78  (right arm) Cuff size:   regular  Vitals Entered By: Arlyss Repress CMA, (August 27, 2010 10:43 AM) CC: bilateral knee pain x 1 week. pain up to 6/10. check both ears...feels like an infection per pt. Is Patient Diabetic? No Pain Assessment Patient in pain? no        Primary Care Provider:  Alvia Grove DO  CC:  bilateral knee pain x 1 week. pain up to 6/10. check both ears...feels like an infection per pt..  History of Present Illness: 1.  Patient complaining of bilateral ear pain for past month or so.  Has had multiple recent airplane rides, greater than 10 in past month and hotel stays from July to early Sept.  First noticed ear pain on most recent trip mid-August to [place].  Describes some phonophobia at that time.  Questionable Sore throat and exacerbation of ear pain this week.  States he can hear "beating" in his ears when they are pressed to pillow at night.  Describes pain as mild, deep, irritated feeling, not sharp, burning, or aching.  Has not tried anything for relief.    2.  Knee pain:  Bilateral knee pain, worse with movement and bending to pray.  Patient is work-in and late for appt, unable to address at this time.    Recently lost his job.    ROS:  no fevers or chills.  No rashes, cough, itchy eyes, sneezing.    Habits & Providers  Alcohol-Tobacco-Diet     Tobacco Status: quit > 6 months     Tobacco Counseling: not to resume use of tobacco products  Current Problems (verified): 1)  Ear Pain, Bilateral  (ICD-388.70) 2)  Fatigue, Acute  (ICD-780.79) 3)  Dizziness  (ICD-780.4) 4)  Hypercholesterolemia  (ICD-272.0) 5)  Dysuria  (ICD-788.1) 6)  Back Strain, Lumbar  (ICD-847.2) 7)  Weakness  (ICD-780.79) 8)  Physical Examination  (ICD-V70.0) 9)  Benign Prostatic Hypertrophy, With  Obstruction  (ICD-600.01) 10)  Constipation, Slow Transit  (ICD-564.01) 11)  Other Abnormality of Urination  (ICD-788.69) 12)  Screening For Lipoid Disorders  (ICD-V77.91) 13)  Ingrown Toenail  (ICD-703.0) 14)  Bph  (ICD-600)  Current Medications (verified): 1)  Flomax 0.4 Mg  Cp24 (Tamsulosin Hcl) .... One Tablet Daily For Benign Prostatic Hypertrophy 2)  Benefiber   Powd (Wheat Dextrin) .... Take As Directed To Increase Fiber Intake.  Disp 1 Tub 3)  Miralax   Powd (Polyethylene Glycol 3350) .Marland Kitchen.. 17g Daily Up To Twice Daily For Constipation.  Disp 170g  Allergies (verified): No Known Drug Allergies  Past History:  Past medical, surgical, family and social histories (including risk factors) reviewed for relevance to current acute and chronic problems.  Past Medical History: Reviewed history from 04/13/2008 and no changes required. single testicle BPH - PSA 0.07 Apr 2008  Past Surgical History: Reviewed history from 04/13/2008 and no changes required. none  Family History: Reviewed history from 04/13/2008 and no changes required. father - died at 10 and healthy mother - healthy in her 18s brothers and sisters   Social History: Reviewed history from 08/08/2010 and no changes required. From [place].  Lives  with wife and 6 children. Works at [employer].  Muslim.  Prior tobacco  abuse.Smoking Status:  quit > 6 months  Physical Exam  General:  Vital signs reviewed. Well-developed, well-nourished patient in NAD.  Awake and cooperative  Head:  normocephalic and atraumatic.  normocephalic and atraumatic.   Eyes:  vision grossly intact, pupils equal, pupils round, and pupils reactive to light.  vision grossly intact, pupils equal, pupils round, and pupils reactive to light.   Ears:  External ear exam shows no significant lesions or deformities, no pain on palpation throughout ear or mastoid process.  Otoscopic examination reveals clear canals, tympanic membranes are intact bilaterally  without bulging, retraction, inflammation or discharge. Hearing is grossly normal bilaterally. Nose:  No turbinate inflammation Mouth:  Oral mucosa and oropharynx without lesions or exudates.  Teeth in good repair.  No tonsillar edema or erythema.   Neck:  supple without lymphadenopathy Lungs:  clear to auscultation bilaterally without wheezing, rales, or rhonchi.  Normal work of breathing  Heart:  Regular rate and rhythm without murmur, rub, or gallop.  Normal S1/S2    Impression & Recommendations:  Problem # 1:  EAR PAIN, BILATERAL (ICD-388.70) Assessment New Most likely irritation from pressure changes during most recent plane rides.  No signs of illness or infection. throat clear, no lymphadenopathy .  Patient very reassured after discussion.  Could be minor inflammation to eustachian tubes during changes, patient does endorse some "popping" in his ears in past week.  Prescribed NSAIDs for pain relief and decrease of inflammation from possible eustachian tube inflammation.  He is to follow up with Dr. Gomez Cleverly if this does not improve.  Also to follow up regarding knee pain.  The following medications were removed from the medication list:    Doxycycline Hyclate 100 Mg Caps (Doxycycline hyclate) ..... One tab by mouth two times a day x 7 days  Orders: [facility]- Est Level  3 (81829)  Complete Medication List: 1)  Flomax 0.4 Mg Cp24 (Tamsulosin hcl) .... One tablet daily for benign prostatic hypertrophy 2)  Benefiber Powd (Wheat dextrin) .... Take as directed to increase fiber intake.  disp 1 tub 3)  Miralax Powd (Polyethylene glycol 3350) .Marland Kitchen.. 17g daily up to twice daily for constipation.  disp 170g  Patient Instructions: 1)  Take the Alleve for discomfort in your ears and knees. 2)  You do not have an ear infection. 3)  Come back and see Dr. Gomez Cleverly to talk about knee and wrist pain. 4)  Try Tylenol PM for sleep at night.   5)  It was good to meet you today!

## 2011-01-02 NOTE — Miscellaneous (Signed)
Summary: Consent Ingrown Toenail  Consent Ingrown Toenail   Imported By: Clydell Hakim 07/24/2010 11:11:29  _____________________________________________________________________  External Attachment:    Type:   Image     Comment:   External Document

## 2011-01-02 NOTE — Assessment & Plan Note (Signed)
Summary: EARS AND KNEES/MJ   Vital Signs:  Patient profile:   45 year old male Height:      70 inches Weight:      157.31 pounds BMI:     22.65 BSA:     1.89 Temp:     98.6 degrees F Pulse rate:   69 / minute BP sitting:   127 / 82  Vitals Entered By: Jone Baseman CMA (September 09, 2010 3:19 PM) CC: Right side pain, reflux and right toe pain Is Patient Diabetic? No Pain Assessment Patient in pain? yes     Location: right toe Intensity: 8   Primary Care Provider:  Alvia Grove DO  CC:  Right side pain and reflux and right toe pain.  History of Present Illness: 45 yo male here for concern of abdominal pain and requesting removal of right ingrown toenail. 1. Abd pain: Intermittent for the past 2 weeks and located in upper abdomen.  Worse after meals.   Described as bloating and cramping.  Pt's wife was recently dx and treated for H. Pylori, pt concerned he may have the same.   Pt endorses nausea which is relieved by eating. No emesis.  Has some epigastric pain and burning, postprandial bloating, and belching.  BM's normal in frequency and consistancy, no blood noted in stools or urine.  No fevers, no chills, no night sweats.  2.  Ingrown toenail: recently had left one removed, interested in having right completed as well.  Has some pain in his big toe.  Not red or swollen, but has tenderness especially if he bumps it or someone steps on his foot.   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 One Tablet Daily For Benign Prostatic Hypertrophy 2)  Benefiber   Powd (Wheat Dextrin) .... Take As Directed To Increase Fiber Intake.  Disp 1 Tub 3)  Miralax   Powd (Polyethylene Glycol 3350) .Marland Kitchen.. 17g Daily Up To Twice Daily For Constipation.  Disp 170g 4)  Prilosec 20 Mg Cpdr (Omeprazole) .... Take 1 Pill By Mouth Two Times A Day For 14 Days 5)  Amoxicillin 500 Mg Caps (Amoxicillin) .... Take 1 Pill By Mouth Two Times A Day For 14 Days 6)  Clarithromycin 500 Mg Tabs (Clarithromycin) .... Take 1 Pill By Mouth Two Times A Day For 14 Days  Allergies (verified): No Known Drug Allergies  Past History:  Past Medical History: Last updated: 04/17/2008 single testicle BPH - PSA 0.07 Apr 2008  Past Surgical History: Last updated: 2008-04-17 none  Family History: Last updated: 2008/04/17 father - died at 54 and healthy mother - healthy in her  68s brothers and sisters   Social History: Last updated: 08/08/2010 From [place].  Lives  with wife and 6 children. Works at [employer].  Muslim.  Prior tobacco abuse.  Risk Factors: Alcohol Use: 0 (09/09/2010) Caffeine Use: 1 (04/06/2008) Exercise: no (04/06/2008)  Risk Factors: Smoking Status: quit > 6 months (09/09/2010) Passive Smoke Exposure: no (09/09/2010)  Social History: Reviewed history from 08/08/2010 and no changes required. From [place].  Lives  with wife and 6 children. Works at [employer].  Muslim.  Prior tobacco abuse.  Review of Systems       The patient complains of abdominal pain.  The patient denies fever, weight loss, chest pain, syncope, dyspnea on exertion, headaches,  melena, hematochezia, severe indigestion/heartburn, hematuria, incontinence, muscle weakness, and unusual weight change.    Physical Exam  General:  Vs reviewed, alert, well-developed, well-nourished, and well-hydrated.   Lungs:  clear to auscultation bilaterally without wheezing, rales, or rhonchi.  Normal work of breathing  Heart:  Regular rate and rhythm without murmur, rub, or gallop.  Normal S1/S2  Abdomen:  soft, upper adbominal tenderness No CVA tenderness, normal bowel sounds, no massessoft, normal bowel sounds, no masses, no guarding, no rigidity, no rebound tenderness Extremities:  R ingrown toenails at both lateral and medial edges, no redness, tender Left great toe s/p medial and later edge removal, no redness, no pus, not swollen, healed well.  Neurologic:  alert & oriented X3 and cranial nerves II-XII intact.   Skin:  turgor normal and color normal.     Impression & Recommendations:  Problem # 1:  ABDOMINAL TENDERNESS, GENERALIZED (ICD-789.67) DDX: Gallstones vs H pylori vs kidney stones vs gastroenterititis UA today to look for blood, concern for ? kidney stones CMP to montior electrolytes, eval renal function, check bilirubin level and liver enzymes. ?H Pylori infection? as wife just dx with same.  No red flag symptoms.  Will treat empirically for H. Pylori and monitor response.  If no improvement may need to consider imaging and further expand workup.  See pt instructions  Problem # 2:  INGROWN TOENAIL (ICD-703.0) left toe healing well, right toe does not look infected.  Pt to schedule appt next week for right toenail removal.  His updated medication list for this problem includes:    Amoxicillin 500 Mg Caps (Amoxicillin) .Marland Kitchen... Take 1 pill by mouth two times a day for 14 days    Clarithromycin 500 Mg Tabs (Clarithromycin) .Marland Kitchen... Take 1 pill by mouth two times a day for 14 days  Orders: [facility]- Est  Level 4 (99214)  Complete Medication List: 1)  Flomax 0.4 Mg Cp24  (Tamsulosin hcl) .... One tablet daily for benign prostatic hypertrophy 2)  Benefiber Powd (Wheat dextrin) .... Take as directed to increase fiber intake.  disp 1 tub 3)  Miralax Powd (Polyethylene glycol 3350) .Marland Kitchen.. 17g daily up to twice daily for constipation.  disp 170g 4)  Prilosec 20 Mg Cpdr (Omeprazole) .... Take 1 pill by mouth two times a day for 14 days 5)  Amoxicillin 500 Mg Caps (Amoxicillin) .... Take 1 pill by mouth two times a day for 14 days 6)  Clarithromycin 500 Mg Tabs (Clarithromycin) .... Take 1 pill by mouth two times a day for 14 days  Other Orders: Urinalysis-[facility] (00000) Comp Met-[facility] ([id number])  Patient Instructions: 1)  Nice to see you today! 2)  I will call you if there is anything concerning on your UA or blood work. 3)  Take the Omeprazole, Amoxicillin, and  Clarithromycin twice a day for 14 days, I have sent them all to [facility]. 4)  Continue taking the Omeprazole after the 14 days. 5)  If your abdominal pain is not better after finishing those meds, please call me and I will schedule an ultrasound for you. 6)  Please make an appt to see Dr. Wallene Huh about your toenail. Prescriptions: CLARITHROMYCIN 500 MG TABS (CLARITHROMYCIN) take 1 pill by mouth two times a day for 14 days  #28 x 0   Entered and Authorized by:   Alvia Grove DO   Signed by:   Alvia Grove DO on 09/09/2010   Method used:   Electronically to        [facility] [id number] (retail)       [address] [place], [place]  [address]       Ph: [phone]       Fax: [phone]   RxID:   [id number] AMOXICILLIN 500 MG CAPS (AMOXICILLIN) take 1 pill by mouth two times a day for 14 days  #28 x 0   Entered and Authorized by:   Alvia Grove DO   Signed by:   Alvia Grove DO on 09/09/2010   Method used:   Electronically to        [facility] [id number] (retail)       [address]       [place], [place]  [address]       Ph:  [phone]       Fax: [phone]   RxID:   [id number] PRILOSEC 20 MG CPDR (OMEPRAZOLE) take 1 pill by mouth two times a day for 14 days  #60 x 1   Entered and Authorized by:   Alvia Grove DO   Signed by:   Alvia Grove DO on 09/09/2010   Method used:   Electronically to        [facility] [id number] (retail)       [address]       [place], [place]  [address]       Ph: [phone]       Fax: [phone]   RxID:   [id number]   Laboratory Results   Urine Tests  Date/Time Received: September 09, 2010 4:03 PM  Date/Time Reported: September 09, 2010 4:17 PM   Routine Urinalysis   Color: yellow Appearance: Clear Glucose: negative   (Normal Range: Negative) Bilirubin: negative   (Normal Range: Negative) Ketone: negative   (Normal Range: Negative) Spec. Gravity: 1.020   (Normal Range: 1.003-1.035) Blood: negative   (Normal Range: Negative) pH: 6.0   (Normal Range: 5.0-8.0) Protein: negative   (Normal Range: Negative) Urobilinogen: 0.2   (Normal Range: 0-1) Nitrite: negative   (Normal Range: Negative) Leukocyte Esterace: negative   (Normal Range: Negative)    Comments: ...........test performed by...........Marland KitchenTerese Door, CMA

## 2011-01-12 ENCOUNTER — Encounter: Payer: Self-pay | Admitting: *Deleted

## 2011-01-14 ENCOUNTER — Other Ambulatory Visit: Payer: Self-pay | Admitting: Family Medicine

## 2011-01-16 ENCOUNTER — Ambulatory Visit (INDEPENDENT_AMBULATORY_CARE_PROVIDER_SITE_OTHER): Payer: Self-pay | Admitting: Family Medicine

## 2011-01-16 ENCOUNTER — Encounter: Payer: Self-pay | Admitting: Family Medicine

## 2011-01-16 VITALS — BP 112/78 | HR 60 | Temp 97.9°F | Ht 68.75 in | Wt 160.8 lb

## 2011-01-16 DIAGNOSIS — L600 Ingrowing nail: Secondary | ICD-10-CM

## 2011-01-16 NOTE — Progress Notes (Signed)
  Subjective:    Patient ID: Michael Fowler, male    DOB: [birth year], 45 y.o.   MRN: [id number]  HPI  Several does to weeks of toe pain.   Review of Systems    No fever or drainage. Objective:   Physical Exam   Great  Toe on left ingrown medial and laterlalborders, no  Sign infection    Assessment & Plan:

## 2011-01-16 NOTE — Assessment & Plan Note (Signed)
Ingrown nail, no infection.  Patient given informed consent, signed copy in the chart. Appropriate time out taken.  Large rubberband with forceps used as tourniquet at base of the toe.  Digital block done using 2% lidocaine without epinephrine, 5cc total.  Area prepped and draped in usual sterile fashion.  Nail elevated using nail elevator, removed using hemostats and traction force.  Nail bed was ablated using curettage and phenol followed by copious alcohol wash.  No complications.  Minimal bleeding.  Sterile bandage applied and post procedure instructions given.  Patient tolerated procedure well without complications.

## 2011-01-20 ENCOUNTER — Other Ambulatory Visit: Payer: Self-pay | Admitting: Family Medicine

## 2011-01-23 ENCOUNTER — Telehealth: Payer: Self-pay | Admitting: Family Medicine

## 2011-01-23 NOTE — Telephone Encounter (Signed)
Dwt If he is having issues he needs to be seen--see if he can either be worked in this pm or tomorrow am as an SDA--I am not here this PM--I am in [facility] tomorrow but he should be seen at [facility]!

## 2011-01-23 NOTE — Telephone Encounter (Signed)
Appointment scheduled for tomorrow at 2:30 PM.

## 2011-01-23 NOTE — Telephone Encounter (Signed)
Had toenail removed last week and is concerned about having fluid build up.

## 2011-01-23 NOTE — Telephone Encounter (Signed)
Will route to Dr. Jennette Kettle who did the toenail removal.

## 2011-01-24 ENCOUNTER — Ambulatory Visit (INDEPENDENT_AMBULATORY_CARE_PROVIDER_SITE_OTHER): Payer: Self-pay | Admitting: Family Medicine

## 2011-01-24 ENCOUNTER — Encounter: Payer: Self-pay | Admitting: Family Medicine

## 2011-01-24 VITALS — BP 118/78 | HR 91 | Wt 162.9 lb

## 2011-01-24 DIAGNOSIS — L600 Ingrowing nail: Secondary | ICD-10-CM

## 2011-01-26 NOTE — Progress Notes (Signed)
  Subjective:    Patient ID: Isabel Freese, male    DOB: Jun 01, 1966, 45 y.o.   MRN: [id number]  HPI  1. Follow-Up Toenail Removal: Patient had toenail removed recently 2/2 several weeks of toe pain 2/2 ingrown nail. He presents today to make sure that the wound looks okay. He denies pain, fever/chills, skin changes, edema, decreased ROM. He cleans the area BID with soap and water, antibiotic ointment, and keeps covered with band-aid. His concern is that the wound seems to drain clear fluid, especially when his leg is down.   Review of Systems See HPI.    Objective:   Physical Exam  Constitutional: He appears well-developed and well-nourished. No distress.  Neurological: He is alert.  Skin:          Left big toenail has been removed. Tissue - good granulation tissue, no sloughing, no surrounding erythema, no edema, no drainage. Wound looks very healthy.          Assessment & Plan:

## 2011-01-27 NOTE — Assessment & Plan Note (Signed)
S/P clean removal. Wound looks great today. Discussed expectations for healing. Elevated leg while sitting. Continue current treatment.

## 2011-03-07 ENCOUNTER — Telehealth: Payer: Self-pay | Admitting: Family Medicine

## 2011-03-12 NOTE — Telephone Encounter (Signed)
Chart opened in error

## 2011-04-29 ENCOUNTER — Ambulatory Visit (INDEPENDENT_AMBULATORY_CARE_PROVIDER_SITE_OTHER): Payer: Medicaid Other | Admitting: Family Medicine

## 2011-04-29 ENCOUNTER — Encounter: Payer: Self-pay | Admitting: Family Medicine

## 2011-04-29 VITALS — BP 122/77 | HR 73 | Temp 97.7°F | Wt 164.0 lb

## 2011-04-29 DIAGNOSIS — R300 Dysuria: Secondary | ICD-10-CM

## 2011-04-29 DIAGNOSIS — N401 Enlarged prostate with lower urinary tract symptoms: Secondary | ICD-10-CM

## 2011-04-29 DIAGNOSIS — K219 Gastro-esophageal reflux disease without esophagitis: Secondary | ICD-10-CM

## 2011-04-29 HISTORY — DX: Gastro-esophageal reflux disease without esophagitis: K21.9

## 2011-04-29 LAB — POCT URINALYSIS DIPSTICK
Glucose, UA: NEGATIVE
Leukocytes, UA: NEGATIVE
Nitrite, UA: NEGATIVE
Protein, UA: NEGATIVE
Urobilinogen, UA: 0.2

## 2011-04-29 MED ORDER — OMEPRAZOLE 20 MG PO CPDR: 20.0000 mg | DELAYED_RELEASE_CAPSULE | Freq: Every day | ORAL | Status: DC

## 2011-04-29 MED ORDER — TAMSULOSIN HCL 0.4 MG PO CAPS: 0.4000 mg | ORAL_CAPSULE | Freq: Every day | ORAL | Status: DC

## 2011-04-29 NOTE — Patient Instructions (Signed)
Follow up in one month with Dr. Katrinka Blazing to see how your reflux is doing, and to get your toenail removed. I am going to put you on a one month trial of omeprazole.   You should stop taking the vitamin to see if that helps with your reflux and constipation. Your urine looks fine today - you should take the medicine for your enlarged prostate every day.

## 2011-04-29 NOTE — Assessment & Plan Note (Signed)
Deteriorated (in setting of medication non-adherence). Benefit noted with use of Flomax. Advised to take daily as prescribed for symptom relief. Last PSA in 05/11 = 1.08. Follow up with PCP as needed.

## 2011-04-29 NOTE — Assessment & Plan Note (Signed)
Deteriorated. Possibly in setting of new vitamin (unable to name vitamin that he is taking). Will have trial of omeprazole for 30 days. Would consider formal testing for H. Pylori in future. Advised to avoid foods that might trigger symptoms.

## 2011-04-29 NOTE — Progress Notes (Signed)
  Subjective:    Patient ID: Michael Fowler, male    DOB: 27-Mar-1966, 45 y.o.   MRN: [id number]  HPI  1) BPH: On Flomax. Reports improvement with Flomax. Takes intermittently (reports that since the medication helped his symptoms he stopped taking it regularly). Notes that he has had some dribbling of his urinary stream, but notes that this goes away when he takes his Flomax regularly. Does not wake to go to urinate Denies hematuria, pyuria, testicular pain, rectal pain, fever, chills, nausea, emesis, costovertebral angle pain, history of kidney stones or bladder / kidney infection.   2) Heartburn: Treated empirically for H. Pylori in October 2011 by previous PCP after complaints of intermittent epigastric burning pain x 2 weeks which was noted to be worse with food. Reported some post-prandial bloating and belching as well. Patient had improvement in symptoms (no test of cure or initial H. pylori testing performed) since then, until starting a new vitamin about 2 weeks ago. Reports above symptoms as well some constipation since starting vitamin. Tried some of his wife's omeprazole with relief in symptoms. Denies diarrhea, nausea, emesis, melena, hematochezia, dysuria, hematuria, fever, chills, weight loss.    Pertinent past history reviewed.  Review of Systems As per HPI     Objective:   Physical Exam  Constitutional: He appears well-developed and well-nourished. No distress.  Cardiovascular: Normal rate, regular rhythm and normal heart sounds.   No murmur heard. Pulmonary/Chest: Effort normal and breath sounds normal. No respiratory distress. He has no wheezes.  Abdominal: Soft. Bowel sounds are normal. He exhibits no distension and no mass. There is no tenderness. There is no rebound and no guarding.       No CVA tenderness           Assessment & Plan:

## 2011-05-13 ENCOUNTER — Ambulatory Visit: Payer: Medicaid Other | Admitting: Family Medicine

## 2011-05-22 ENCOUNTER — Ambulatory Visit: Payer: Medicaid Other | Admitting: Family Medicine

## 2011-05-23 ENCOUNTER — Ambulatory Visit: Payer: Medicaid Other | Admitting: Family Medicine

## 2011-05-30 ENCOUNTER — Encounter: Payer: Self-pay | Admitting: Family Medicine

## 2011-05-30 ENCOUNTER — Ambulatory Visit (INDEPENDENT_AMBULATORY_CARE_PROVIDER_SITE_OTHER): Payer: Medicaid Other | Admitting: Family Medicine

## 2011-05-30 VITALS — BP 127/81 | HR 71 | Temp 97.7°F | Wt 166.0 lb

## 2011-05-30 DIAGNOSIS — N401 Enlarged prostate with lower urinary tract symptoms: Secondary | ICD-10-CM

## 2011-05-30 DIAGNOSIS — S335XXA Sprain of ligaments of lumbar spine, initial encounter: Secondary | ICD-10-CM

## 2011-05-30 DIAGNOSIS — H543 Unqualified visual loss, both eyes: Secondary | ICD-10-CM | POA: Insufficient documentation

## 2011-05-30 DIAGNOSIS — E7800 Pure hypercholesterolemia, unspecified: Secondary | ICD-10-CM

## 2011-05-30 LAB — BASIC METABOLIC PANEL
Calcium: 8.7 mg/dL (ref 8.4–10.5)
Glucose, Bld: 94 mg/dL (ref 70–99)
Sodium: 141 mEq/L (ref 135–145)

## 2011-05-30 LAB — PSA: PSA: 1.07 ng/mL (ref <=4.00)

## 2011-05-30 LAB — LIPID PANEL
Cholesterol: 240 mg/dL — ABNORMAL HIGH (ref 0–200)
HDL: 42 mg/dL (ref >39)
Triglycerides: 88 mg/dL (ref <150)

## 2011-05-30 NOTE — Assessment & Plan Note (Signed)
Improved since restarting flomax. Plan to recheck PSA.

## 2011-05-30 NOTE — Assessment & Plan Note (Signed)
Pt not taking statin. Plan to reassess with lipid profile. Will likely benefit from statin if LDL still extremely elevated (last LDL 209) his goal would be LDL <130.

## 2011-05-30 NOTE — Assessment & Plan Note (Signed)
Improved. Pt with intermittent complaints. Advised use of NSAIDs and regular exercise. Pt w/o constitutional symptoms to suggest underlying malignancy.

## 2011-05-30 NOTE — Assessment & Plan Note (Signed)
Pt due for eye exam. Referred to optometry. Suspect need for new prescription. No evidence of acute neurological compromise.

## 2011-05-30 NOTE — Patient Instructions (Signed)
Mr. Arbutus Ped,  Thank you for coming in today. It is always a pleasure seeing you.  For your back and knee pain: please take Aleve/motrin/ibuprofen when you are going to be very active and to prevent pain. It is safe and effective when you as directed.  For weight maintenance: you are at a healthy weight now. Please maintain your current weight by eating plenty of low calorie foods (veggies with lunch and dinner) and exercising regualru (4-5x/week) at a moderate pace.  My nurse will schedule your appointment with Optometry. Here is the website we talked about for affordable glasses,  [email or web address]  I will contact you by phone or letter about your blood work.   For trouble falling asleep there is a few things you can do to improve "sleep hygiene" 1. Regular exercise, but not after 8 PM, 2. Bed for sleep and sex only (no TV or reading) 3. Do not drink caffeine or eat sugary food before bed.  Please f/u with me to discuss in more detail if you are still having trouble sleeping.

## 2011-05-30 NOTE — Progress Notes (Signed)
  Subjective:    Patient ID: Michael Fowler, male    DOB: Feb 04, 1966, 45 y.o.   MRN: [id number]  HPI  1. Vision- notice blurred vision when he reads or after watching TV for 1.5 hr. He has to squint to clear his vision. He last had his vision checked over a year ago in [place]. He had his glasses changed at this. He complete loss of vision, tunnel vision, pain in his eyes. His eyes feel tired. He denies associated facial droop.   2. B/l knee pain- He denies knee pain now. Pain in knees when he walks and if he tries to run, going up and down stairs. He does not take NSAIDs or a regular basis, but will take Advil sometimes. He denies weakness. He denies tingling and numbness in his legs.   3. Low back pain- He has intermittent low back pain. He denies radiiation of pain to posterior thigh.   4. Head-flakiness and pain when he goes out in the sun. Pt started using [place] oil and a new anti-dandruff shampoo recently. He denies itching. He also denies burning other than when he is exposed to the sun.   5. Hyperlipidemia- pt is not taking medication. He has increased fiber in his diet and improved his diet through increased intake of vegetables. He describes his diet as healthy. He avoids foods high in calories and sugar. He does not drink alcohol. He does not exercise on a regular basis.    Review of Systems  Constitutional: Negative.   HENT: Negative.   Eyes: Negative.        Worsening vision (blurred vision).   Respiratory: Negative.   Cardiovascular: Negative.   Gastrointestinal: Positive for constipation. Negative for nausea, vomiting, abdominal pain, diarrhea, blood in stool, abdominal distention, anal bleeding and rectal pain.  Genitourinary: Negative.   Musculoskeletal: Positive for back pain and arthralgias. Negative for myalgias, joint swelling and gait problem.  Skin: Negative.   Neurological: Negative.        Objective:   Physical Exam  Vitals reviewed. Constitutional: He appears  well-developed and well-nourished.  HENT:  Head: Normocephalic and atraumatic.  Right Ear: Tympanic membrane, external ear and ear canal normal.  Left Ear: Tympanic membrane, external ear and ear canal normal.  Cardiovascular: Normal rate, regular rhythm, normal heart sounds and intact distal pulses.   Pulmonary/Chest: Effort normal and breath sounds normal.  Musculoskeletal: Normal range of motion.       Right knee: Normal.       Left knee: Normal.       Lumbar back: Normal.  Skin: Skin is warm and dry. No rash noted.  MSK: negative sitting straight leg raising test. Non tender along lumbar spine.         Assessment & Plan:  45 yo M healthy adult male here for routine physical. 1. Anticipatory guidance about weight management and disease prevention provided. Pt is aware of colonoscopy starting at age 3. He declines yearly flu vaccine.

## 2011-06-02 ENCOUNTER — Encounter: Payer: Self-pay | Admitting: Family Medicine

## 2011-06-03 ENCOUNTER — Telehealth: Payer: Self-pay | Admitting: Family Medicine

## 2011-06-03 NOTE — Telephone Encounter (Signed)
Pt is wanting to know about eye doctor referral

## 2011-06-03 NOTE — Telephone Encounter (Signed)
Informed patient that medicaid doesn't cover optometry for adults, he will call around and see if he can find someone who is affordable.

## 2011-08-12 ENCOUNTER — Ambulatory Visit (INDEPENDENT_AMBULATORY_CARE_PROVIDER_SITE_OTHER): Payer: Medicaid Other | Admitting: Family Medicine

## 2011-08-12 ENCOUNTER — Encounter: Payer: Self-pay | Admitting: Family Medicine

## 2011-08-12 VITALS — BP 118/72 | HR 74 | Temp 98.3°F | Wt 164.1 lb

## 2011-08-12 DIAGNOSIS — J019 Acute sinusitis, unspecified: Secondary | ICD-10-CM | POA: Insufficient documentation

## 2011-08-12 MED ORDER — DOXYCYCLINE HYCLATE 100 MG PO TABS: 100.0000 mg | ORAL_TABLET | Freq: Two times a day (BID) | ORAL | Status: AC

## 2011-08-12 MED ORDER — HYDROCODONE-HOMATROPINE 5-1.5 MG/5ML PO SYRP: 5.0000 mL | ORAL_SOLUTION | Freq: Four times a day (QID) | ORAL | Status: AC | PRN

## 2011-08-12 NOTE — Patient Instructions (Signed)
Take all of the anabiotic Use the cough medicine at night and during the day as needed

## 2011-08-12 NOTE — Progress Notes (Signed)
  Subjective:    Patient ID: Michael Fowler, male    DOB: 05/05/1966, 45 y.o.   MRN: [id number]  HPI Describes cough since 8/18, was in [place] after his brother's death following a kidney transplant.  He was around a lot of people, coughed in the plane the whole way back from [place].  Up all night coughing.   Review of Systems  Constitutional: Negative for fever and chills.  Respiratory: Positive for choking and chest tightness. Negative for wheezing.   Cardiovascular: Negative for chest pain.       Objective:   Physical Exam  Constitutional: He appears well-developed and well-nourished.  HENT:  Right Ear: External ear normal.  Left Ear: External ear normal.       Thick copious post nasal drainage  Neck: Normal range of motion. Neck supple.  Cardiovascular: Normal rate, regular rhythm and normal heart sounds.   Pulmonary/Chest: Effort normal and breath sounds normal.          Assessment & Plan:

## 2011-08-12 NOTE — Assessment & Plan Note (Signed)
Doxy, recent international airplane travel, hydrocodone cough syrup at night

## 2011-08-26 ENCOUNTER — Ambulatory Visit: Payer: Medicaid Other | Admitting: Family Medicine

## 2011-09-01 ENCOUNTER — Other Ambulatory Visit: Payer: Self-pay | Admitting: Family Medicine

## 2011-09-01 ENCOUNTER — Encounter: Payer: Self-pay | Admitting: Family Medicine

## 2011-09-01 ENCOUNTER — Ambulatory Visit (INDEPENDENT_AMBULATORY_CARE_PROVIDER_SITE_OTHER): Payer: Medicaid Other | Admitting: Family Medicine

## 2011-09-01 VITALS — BP 145/82 | HR 73 | Temp 98.2°F | Wt 165.9 lb

## 2011-09-01 DIAGNOSIS — R42 Dizziness and giddiness: Secondary | ICD-10-CM

## 2011-09-01 HISTORY — DX: Dizziness and giddiness: R42

## 2011-09-01 MED ORDER — FLUTICASONE PROPIONATE 50 MCG/ACT NA SUSP: 1.0000 | Freq: Every day | NASAL | Status: DC

## 2011-09-01 MED ORDER — CETIRIZINE HCL 10 MG PO TABS: 10.0000 mg | ORAL_TABLET | Freq: Every day | ORAL | Status: DC

## 2011-09-01 NOTE — Patient Instructions (Signed)
I think that you might have some ear fullness after your illness I think that this will go away on its own, but I will give you some flonase to try  Please drink 3-4 bottles of water a day Try to sleep only at night unless you really need to take a nap  I think your jet lag may also have something to do with your symptoms.    Please take tums or mylanta if you have heartburn.

## 2011-09-01 NOTE — Telephone Encounter (Signed)
Refill request

## 2011-09-01 NOTE — Progress Notes (Signed)
  Subjective:    Patient ID: Michael Fowler, male    DOB: 06/17/1966, 45 y.o.   MRN: [id number]  HPI  Pt presents with 2 weeks of a feeling of intermittent HA, dizziness, a muscle twitch on the side of his head and watery eyes that cause some blurry vision.  He states that the HA is all over his head and he takes some aleve without much help.  He has dizziness when he stands rapidly or when he is lifting his head from the floor during daily prayers.  The muscle twitch is intermittent and is not bothersome except for the sensation.  Pt notes that at night he will feel tired and his eyes will water and he will have some blurring of his vision.  He saw an opthomologist 1 month ago.    Pt was in [place] for 3 weeks attending his brothers funeral.  He endorses fatigue and trouble sleeping since that trip. The HA and dizziness started at the same time.  Right after his return, he was treated for sinusitis.    Review of Systems No N/V/D, no weakness, no fall or syncope    Objective:   Physical Exam Vital signs reviewed General appearance - alert, well appearing, and in no distress and oriented to person, place, and time Eyes - pupils equal and reactive, extraocular eye movements intact, sclera anicteric, some mild watering of eyes Ears - bilateral TM's with air fluid level, no erythema.  and external ear canals normal, right ear normal, left ear normal Nose - normal and patent, no erythema, discharge or polyps Heart - normal rate, regular rhythm, normal S1, S2, no murmurs, rubs, clicks or gallops Chest - clear to auscultation, no wheezes, rales or rhonchi, symmetric air entry, no tachypnea, retractions or cyanosis Neurological - alert, oriented, normal speech, no focal findings or movement disorder noted, screening mental status exam normal, neck supple without rigidity, cranial nerves II through XII intact         Assessment & Plan:

## 2011-09-01 NOTE — Assessment & Plan Note (Signed)
Pt with mult complaints including dizziness, intermittent HA and blurred vision (when eyes water) that I think are a combination of jet lag and stress.  Pt wanted to try to help his ears clear, so I wrote for some allergy medications.  I advised pt that many of these complaints may be due to his change in sleeping pattern and advised exercise and good sleep hygeine.

## 2011-09-10 ENCOUNTER — Encounter: Payer: Self-pay | Admitting: Family Medicine

## 2011-09-10 ENCOUNTER — Ambulatory Visit (INDEPENDENT_AMBULATORY_CARE_PROVIDER_SITE_OTHER): Payer: Medicaid Other | Admitting: Family Medicine

## 2011-09-10 VITALS — BP 132/76 | HR 67 | Temp 97.9°F | Wt 163.0 lb

## 2011-09-10 DIAGNOSIS — Z23 Encounter for immunization: Secondary | ICD-10-CM

## 2011-09-10 DIAGNOSIS — H543 Unqualified visual loss, both eyes: Secondary | ICD-10-CM

## 2011-09-10 NOTE — Patient Instructions (Signed)
[name],  Thank you for coming in today. We will check your fingers stick blood sugar.   I will send you the eye doctor for evaluation.   Please check your BP at home 2x/week.  F/u in 2-3 weeks for BP f/u.   -Dr. Armen Pickup   Zyrtec-prn and you make take motrin up to 600 mg as needed for headache.

## 2011-09-12 ENCOUNTER — Telehealth: Payer: Self-pay | Admitting: Family Medicine

## 2011-09-12 NOTE — Telephone Encounter (Signed)
[name] is calling about a referral for an eye doctor.  He is wanting to know when that appt is going to be.

## 2011-09-15 NOTE — Telephone Encounter (Signed)
The only referral I see was placed back in June.  Is this the same kind of referral you spoke with the patient about? Merryl Buckels, [name]

## 2011-09-17 NOTE — Progress Notes (Signed)
  Subjective:    Patient ID: Michael Fowler, male    DOB: 07-10-1966, 45 y.o.   MRN: [id number]  HPI Pt here to discuss decreased vision at night. He states that he is not able to see the words on a page as well as he used to when he is reading at night. He had his vision last checked in the [place] and it was recommended that he gets new reading glasses. He request a referral to ophthalmology to be assessed for new glasses. He distant vision is intact. He denies pain in his eyes, he denies tunnel vision. He admits to headaches at night.    He states that he was taking zyrtec daily following a recent UR. We discussed taking Zyrtec only as needed.   He desires his flu shot today.  Review of Systems Problem focused ROS as per HPI    Objective:   Physical Exam  Nursing note and vitals reviewed. Constitutional: He appears well-developed and well-nourished. No distress.  HENT:  Right Ear: External ear normal.  Left Ear: External ear normal.  Mouth/Throat: Oropharynx is clear and moist.  Eyes: Conjunctivae are normal. Pupils are equal, round, and reactive to light. Right eye exhibits no discharge.  Fundoscopic exam:      The right eye shows no arteriolar narrowing and no AV nicking.       The left eye shows no arteriolar narrowing and no AV nicking.  Cardiovascular: Normal rate, regular rhythm and normal heart sounds.   Pulmonary/Chest: Effort normal and breath sounds normal.          Assessment & Plan:

## 2011-09-17 NOTE — Assessment & Plan Note (Signed)
Pt due for eye exam/new  prescription. Referred to ophthalmology per request (he would like to go to his wife's doctor). He is not diabetic.

## 2011-09-19 DIAGNOSIS — Z23 Encounter for immunization: Secondary | ICD-10-CM | POA: Insufficient documentation

## 2011-09-19 NOTE — Telephone Encounter (Signed)
Referral and letter to [facility] team.

## 2011-09-22 NOTE — Telephone Encounter (Signed)
Michael Fowler has already made the appt Fleeger, [name]

## 2011-10-27 ENCOUNTER — Other Ambulatory Visit: Payer: Self-pay | Admitting: Family Medicine

## 2011-10-27 NOTE — Telephone Encounter (Signed)
Refill request

## 2011-10-31 ENCOUNTER — Ambulatory Visit (INDEPENDENT_AMBULATORY_CARE_PROVIDER_SITE_OTHER): Payer: Medicaid Other | Admitting: Family Medicine

## 2011-10-31 ENCOUNTER — Encounter: Payer: Self-pay | Admitting: Family Medicine

## 2011-10-31 VITALS — BP 129/81 | HR 68 | Temp 97.7°F | Ht 68.75 in | Wt 161.0 lb

## 2011-10-31 DIAGNOSIS — R42 Dizziness and giddiness: Secondary | ICD-10-CM

## 2011-10-31 DIAGNOSIS — J309 Allergic rhinitis, unspecified: Secondary | ICD-10-CM

## 2011-10-31 MED ORDER — CETIRIZINE HCL 10 MG PO TABS: 10.0000 mg | ORAL_TABLET | Freq: Every day | ORAL | Status: DC

## 2011-10-31 MED ORDER — FLUTICASONE PROPIONATE 50 MCG/ACT NA SUSP: NASAL | Status: DC

## 2011-10-31 NOTE — Progress Notes (Signed)
  Subjective:    Patient ID: Michael Fowler, male    DOB: 07-Mar-1966, 45 y.o.   MRN: [id number]  HPI Patient presents with nasal symptoms. Itchy eyes, nasal congestion, runny nose, postnasal drip x1 week. Also with intermittent cough. Patient states she's also noticed some sore throat. No fever, rash, odynophagia. Pt states that he has a baseline hx/o allergic disease including seasonal nasal congestion and rhinorrhea. Sxs are more prominent in the spring and fall. Sxs today feel similar to previous allergy flares.    Review of Systems See HPI               Objective:   Physical Exam Gen: in bed, NAD HEENT: TMs clear bilaterally, + nasal erythema and turbinate swelling bilaterally, + post oropharyngeal erythema. CV: RRR, no murmurs PULM: CTAB, no wheezes   Assessment & Plan:

## 2011-10-31 NOTE — Patient Instructions (Signed)
Allergic Rhinitis Allergic rhinitis is when the mucous membranes in the nose respond to allergens. Allergens are particles in the air that cause your body to have an allergic reaction. This causes you to release allergic antibodies. Through a chain of events, these eventually cause you to release histamine into the blood stream (hence the use of antihistamines). Although meant to be protective to the body, it is this release that causes your discomfort, such as frequent sneezing, congestion and an itchy runny nose.  CAUSES  The pollen allergens may come from grasses, trees, and weeds. This is seasonal allergic rhinitis, or "hay fever." Other allergens cause year-round allergic rhinitis (perennial allergic rhinitis) such as house dust mite allergen, pet dander and mold spores.  SYMPTOMS   Nasal stuffiness (congestion).   Runny, itchy nose with sneezing and tearing of the eyes.   There is often an itching of the mouth, eyes and ears.  It cannot be cured, but it can be controlled with medications. DIAGNOSIS  If you are unable to determine the offending allergen, skin or blood testing may find it. TREATMENT   Avoid the allergen.   Medications and allergy shots (immunotherapy) can help.   Hay fever may often be treated with antihistamines in pill or nasal spray forms. Antihistamines block the effects of histamine. There are over-the-counter medicines that may help with nasal congestion and swelling around the eyes. Check with your caregiver before taking or giving this medicine.  If the treatment above does not work, there are many new medications your caregiver can prescribe. Stronger medications may be used if initial measures are ineffective. Desensitizing injections can be used if medications and avoidance fails. Desensitization is when a patient is given ongoing shots until the body becomes less sensitive to the allergen. Make sure you follow up with your caregiver if problems continue. SEEK  MEDICAL CARE IF:   You develop fever (more than 100.5 F (38.1 C).   You develop a cough that does not stop easily (persistent).   You have shortness of breath.   You start wheezing.   Symptoms interfere with normal daily activities.  Document Released: 08/12/2001 Document Revised: 07/30/2011 Document Reviewed: 02/21/2009 [employer] Patient Information 2012 [employer]. 

## 2011-11-02 DIAGNOSIS — J309 Allergic rhinitis, unspecified: Secondary | ICD-10-CM | POA: Insufficient documentation

## 2011-11-02 NOTE — Assessment & Plan Note (Signed)
Likely exacerbated by viral URI. Will start pt on zyrtec and flonase. Discussed infectious red flags. Handout given.

## 2011-11-03 ENCOUNTER — Telehealth: Payer: Self-pay | Admitting: Family Medicine

## 2011-11-03 NOTE — Telephone Encounter (Signed)
Called patient at home. Unable to reach patient. Left voice mail informing pt that I would  be happy to refill cough medicine, but it has to be carried in to the pharmacy. Asked patient to call the clinic in the morning if this is what he would like to do.

## 2011-11-03 NOTE — Telephone Encounter (Signed)
Michael Fowler is asking for a rx sent to his pharmacy for the cough syrup he was prescribed in September by [name].  Please send to [facility] on [address]

## 2011-11-04 ENCOUNTER — Other Ambulatory Visit: Payer: Self-pay | Admitting: Family Medicine

## 2011-11-04 MED ORDER — HYDROCODONE-HOMATROPINE 5-1.5 MG/5ML PO SYRP: 5.0000 mL | ORAL_SOLUTION | Freq: Four times a day (QID) | ORAL | Status: DC | PRN

## 2011-11-04 NOTE — Telephone Encounter (Signed)
Called pt. Script for [employer] available. Pt will pick it up tomorrow.

## 2011-11-05 ENCOUNTER — Ambulatory Visit (INDEPENDENT_AMBULATORY_CARE_PROVIDER_SITE_OTHER): Payer: Medicaid Other | Admitting: Family Medicine

## 2011-11-05 ENCOUNTER — Encounter: Payer: Self-pay | Admitting: Family Medicine

## 2011-11-05 DIAGNOSIS — J069 Acute upper respiratory infection, unspecified: Secondary | ICD-10-CM

## 2011-11-05 MED ORDER — HYDROCODONE-HOMATROPINE 5-1.5 MG/5ML PO SYRP: 5.0000 mL | ORAL_SOLUTION | Freq: Four times a day (QID) | ORAL | Status: AC | PRN

## 2011-11-05 NOTE — Assessment & Plan Note (Signed)
I suspect that his symptoms are due to a virus. I refilled his hydrocodone cough medicine discussed symptomatic treatment with Tylenol and ibuprofen and gave him a handout on colds. We discussed that this could be influenza but I doubt it as he appears to well. He will follow up with primary care provider in a month or sooner if needed

## 2011-11-05 NOTE — Patient Instructions (Signed)
Thank you for coming in today. I think you have a cold virus, but he may also have the flu. I represcribed your cough medicine. You can also use over-the-counter cough medicine with guaifenesin in it. As Tylenol or ibuprofen as needed for fevers bodyaches and chills. This could take a week or 2 weeks to get better.  Upper Respiratory Infection, Adult An upper respiratory infection (URI) is also sometimes known as the common cold. The upper respiratory tract includes the nose, sinuses, throat, trachea, and bronchi. Bronchi are the airways leading to the lungs. Most people improve within 1 week, but symptoms can last up to 2 weeks. A residual cough may last even longer.   CAUSES Many different viruses can infect the tissues lining the upper respiratory tract. The tissues become irritated and inflamed and often become very moist. Mucus production is also common. A cold is contagious. You can easily spread the virus to others by oral contact. This includes kissing, sharing a glass, coughing, or sneezing. Touching your mouth or nose and then touching a surface, which is then touched by another person, can also spread the virus. SYMPTOMS   Symptoms typically develop 1 to 3 days after you come in contact with a cold virus. Symptoms vary from person to person. They may include:  Runny nose.     Sneezing.    Nasal congestion.     Sinus irritation.     Sore throat.     Loss of voice (laryngitis).     Cough.    Fatigue.    Muscle aches.     Loss of appetite.     Headache.    Low-grade fever.  DIAGNOSIS   You might diagnose your own cold based on familiar symptoms, since most people get a cold 2 to 3 times a year. Your caregiver can confirm this based on your exam. Most importantly, your caregiver can check that your symptoms are not due to another disease such as strep throat, sinusitis, pneumonia, asthma, or epiglottitis. Blood tests, throat tests, and X-rays are not necessary to diagnose  a common cold, but they may sometimes be helpful in excluding other more serious diseases. Your caregiver will decide if any further tests are required. RISKS AND COMPLICATIONS   You may be at risk for a more severe case of the common cold if you smoke cigarettes, have chronic heart disease (such as heart failure) or lung disease (such as asthma), or if you have a weakened immune system. The very young and very old are also at risk for more serious infections. Bacterial sinusitis, middle ear infections, and bacterial pneumonia can complicate the common cold. The common cold can worsen asthma and chronic obstructive pulmonary disease (COPD). Sometimes, these complications can require emergency medical care and may be life-threatening. PREVENTION   The best way to protect against getting a cold is to practice good hygiene. Avoid oral or hand contact with people with cold symptoms. Wash your hands often if contact occurs. There is no clear evidence that vitamin C, vitamin E, echinacea, or exercise reduces the chance of developing a cold. However, it is always recommended to get plenty of rest and practice good nutrition. TREATMENT   Treatment is directed at relieving symptoms. There is no cure. Antibiotics are not effective, because the infection is caused by a virus, not by bacteria. Treatment may include:  Increased fluid intake. Sports drinks offer valuable electrolytes, sugars, and fluids.     Breathing heated mist or steam (vaporizer or  shower).     Eating chicken soup or other clear broths, and maintaining good nutrition.     Getting plenty of rest.     Using gargles or lozenges for comfort.     Controlling fevers with ibuprofen or acetaminophen as directed by your caregiver.     Increasing usage of your inhaler if you have asthma.  Zinc gel and zinc lozenges, taken in the first 24 hours of the common cold, can shorten the duration and lessen the severity of symptoms. Pain medicines may help  with fever, muscle aches, and throat pain. A variety of non-prescription medicines are available to treat congestion and runny nose. Your caregiver can make recommendations and may suggest nasal or lung inhalers for other symptoms.   HOME CARE INSTRUCTIONS    Only take over-the-counter or prescription medicines for pain, discomfort, or fever as directed by your caregiver.     Use a warm mist humidifier or inhale steam from a shower to increase air moisture. This may keep secretions moist and make it easier to breathe.     Drink enough water and fluids to keep your urine clear or pale yellow.     Rest as needed.     Return to work when your temperature has returned to normal or as your caregiver advises. You may need to stay home longer to avoid infecting others. You can also use a face mask and careful hand washing to prevent spread of the virus.  SEEK MEDICAL CARE IF:    After the first few days, you feel you are getting worse rather than better.     You need your caregiver's advice about medicines to control symptoms.     You develop chills, worsening shortness of breath, or brown or red sputum. These may be signs of pneumonia.     You develop yellow or brown nasal discharge or pain in the face, especially when you bend forward. These may be signs of sinusitis.     You develop a fever, swollen neck glands, pain with swallowing, or white areas in the back of your throat. These may be signs of strep throat.  SEEK IMMEDIATE MEDICAL CARE IF:    You have a fever.     You develop severe or persistent headache, ear pain, sinus pain, or chest pain.     You develop wheezing, a prolonged cough, cough up blood, or have a change in your usual mucus (if you have chronic lung disease).     You develop sore muscles or a stiff neck.  Document Released: 05/13/2001 Document Revised: 07/30/2011 Document Reviewed: 03/21/2011 [facility] Patient Information 2012 [place], [place].

## 2011-11-05 NOTE — Progress Notes (Signed)
[name] is here to followup his cough. He describes a nonproductive cough subjective fever and a headache for over one week. He was evaluated in clinic on Friday and diagnosed with allergies. In the interval his cough has continued despite the use of Flonase nasal spray and Zyrtec. He feels generally okay he denies any severe body aches or objective fever. He denies any sick contacts at home.  PMH reviewed.  ROS as above otherwise neg Medications reviewed. Current Outpatient Prescriptions  Medication Sig Dispense Refill  . cetirizine (ZYRTEC ALLERGY) 10 MG tablet Take 1 tablet (10 mg total) by mouth daily.  30 tablet  11  . fluticasone (FLONASE) 50 MCG/ACT nasal spray 2 sprays into each nostril daily  16 g  6  . HYDROcodone-homatropine (HYCODAN) 5-1.5 MG/5ML syrup Take 5 mLs by mouth every 6 (six) hours as needed for cough.  120 mL  0  . Tamsulosin HCl (FLOMAX) 0.4 MG CAPS TAKE ONE CAPSULE BY MOUTH ONE TIME DAILY FOR PROSTATE  30 capsule  0    Exam:  BP 132/81  Pulse 80  Temp(Src) 98.4 F (36.9 C) (Oral)  Ht 5' 8.75" (1.746 m)  Wt 164 lb (74.39 kg)  BMI 24.40 kg/m2 Gen: Well NAD HEENT: EOMI,  MMM, cobblestoning of posterior pharynx Lungs: CTABL Nl WOB Heart: RRR no MRG Abd: NABS, NT, ND Exts: Non edematous BL  LE, warm and well perfused.

## 2011-11-10 NOTE — Telephone Encounter (Signed)
Michael Fowler, please sign and close encounter

## 2011-12-05 ENCOUNTER — Other Ambulatory Visit: Payer: Self-pay | Admitting: Family Medicine

## 2011-12-05 NOTE — Telephone Encounter (Signed)
Refill request

## 2012-02-06 ENCOUNTER — Telehealth: Payer: Self-pay | Admitting: Family Medicine

## 2012-02-06 DIAGNOSIS — N400 Enlarged prostate without lower urinary tract symptoms: Secondary | ICD-10-CM

## 2012-02-06 MED ORDER — TAMSULOSIN HCL 0.4 MG PO CAPS: 0.4000 mg | ORAL_CAPSULE | Freq: Every day | ORAL | Status: DC

## 2012-02-06 NOTE — Telephone Encounter (Signed)
Patient is calling because his Pharmacy put in a request for a refill on his Prostate medication, he did not know the name of, but they have not heard back.  There is no request in Dr. Armen Pickup box.

## 2012-02-06 NOTE — Telephone Encounter (Signed)
Flomax sent to  Pharmacy.  Patient notified.  Also due for appointment with Dr. Armen Pickup.  Scheduled for 03/05/12 @ 3:00pm  Ileana Ladd

## 2012-03-05 ENCOUNTER — Ambulatory Visit (INDEPENDENT_AMBULATORY_CARE_PROVIDER_SITE_OTHER): Payer: Medicaid Other | Admitting: Family Medicine

## 2012-03-05 ENCOUNTER — Encounter: Payer: Self-pay | Admitting: Family Medicine

## 2012-03-05 VITALS — BP 129/79 | HR 79 | Temp 98.4°F | Ht 68.75 in | Wt 164.0 lb

## 2012-03-05 DIAGNOSIS — N400 Enlarged prostate without lower urinary tract symptoms: Secondary | ICD-10-CM

## 2012-03-05 DIAGNOSIS — N401 Enlarged prostate with lower urinary tract symptoms: Secondary | ICD-10-CM

## 2012-03-05 MED ORDER — TAMSULOSIN HCL 0.4 MG PO CAPS: 0.4000 mg | ORAL_CAPSULE | Freq: Every day | ORAL | Status: DC

## 2012-03-05 NOTE — Patient Instructions (Signed)
Michael Fowler,  Thank you for coming in to see me today. Your prostate is still enlarged. You should continue the flomax daily.   Keep in mind at age 46 we will screen for prostate cancer and colon cancer. Please f/u for physicals every 1-2 years and as needed.   Dr. Armen Pickup

## 2012-03-10 ENCOUNTER — Encounter: Payer: Self-pay | Admitting: Family Medicine

## 2012-03-10 NOTE — Assessment & Plan Note (Signed)
Well controlled Continue flomax 

## 2012-03-10 NOTE — Progress Notes (Signed)
Subjective:     Patient ID: Michael Fowler, male   DOB: 19-Oct-1966, 46 y.o.   MRN: [id number]  HPI 46 yo M presents for a refill of flomax and to discuss BPH. He takes flomax daily. Prior to starting her would wak up at night to urinate. He now does not wake up at night. He denies feeling light headed or dizzy. He denies weight loss, night sweats and fatigue. He wants t know if he should continue it. He denies a family history of cancer including prostate cancer.   Review of Systems As per HPI    Objective:   Physical Exam BP 129/79  Pulse 79  Temp(Src) 98.4 F (36.9 C) (Oral)  Ht 5' 8.75" (1.746 m)  Wt 164 lb (74.39 kg)  BMI 24.40 kg/m2 General appearance: alert, cooperative and no distress Prostate: symmetrically enlarged. No nodules. Stool in rectal vault without blood.    Lab Results  Component Value Date   PSA 1.07 05/30/2011   PSA 1.08 04/16/2010   PSA 0.80 04/06/2008       Assessment:     BPH well controlled     Plan:     Continue Flomax refills given today. Discussed need fr physical at age 69.

## 2012-04-07 ENCOUNTER — Telehealth: Payer: Self-pay | Admitting: Family Medicine

## 2012-04-07 DIAGNOSIS — H34812 Central retinal vein occlusion, left eye: Secondary | ICD-10-CM

## 2012-04-07 NOTE — Telephone Encounter (Signed)
Pt was seen at his office today and he is calling to speak with Dr Armen Pickup about this appt.

## 2012-04-08 ENCOUNTER — Telehealth: Payer: Self-pay | Admitting: Family Medicine

## 2012-04-08 DIAGNOSIS — H34832 Tributary (branch) retinal vein occlusion, left eye: Secondary | ICD-10-CM

## 2012-04-08 DIAGNOSIS — H34812 Central retinal vein occlusion, left eye: Secondary | ICD-10-CM | POA: Insufficient documentation

## 2012-04-08 MED ORDER — ASPIRIN EC 81 MG PO TBEC: 81.0000 mg | DELAYED_RELEASE_TABLET | Freq: Every day | ORAL | Status: AC

## 2012-04-08 NOTE — Telephone Encounter (Signed)
Called patient. Left message with his wife and nephew that  I will do some test on his heart. Asked for patient to call me back.   Will definitely check glucose and lipids. Will discuss utility of EKG and ECHO with attendings.

## 2012-04-08 NOTE — Telephone Encounter (Signed)
Returned call to Dr. Mitzi Davenport. 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 He will need a cardiovascular work-up lipids, blood sugar, EKG and ECHO to evaluate.

## 2012-04-08 NOTE — Telephone Encounter (Signed)
Called patient at home. He will need to come in for blood work and EKG. He is informed that he can come in fasting for his blood work. He will need an appt for the EKG.

## 2012-04-09 ENCOUNTER — Other Ambulatory Visit: Payer: Medicaid Other

## 2012-04-09 DIAGNOSIS — H34832 Tributary (branch) retinal vein occlusion, left eye: Secondary | ICD-10-CM

## 2012-04-09 LAB — COMPREHENSIVE METABOLIC PANEL
Albumin: 4.4 g/dL (ref 3.5–5.2)
Alkaline Phosphatase: 63 U/L (ref 39–117)
BUN: 9 mg/dL (ref 6–23)
CO2: 28 mEq/L (ref 19–32)
Calcium: 8.8 mg/dL (ref 8.4–10.5)
Chloride: 105 mEq/L (ref 96–112)
Glucose, Bld: 95 mg/dL (ref 70–99)
Potassium: 4.2 mEq/L (ref 3.5–5.3)
Sodium: 137 mEq/L (ref 135–145)
Total Protein: 6.8 g/dL (ref 6.0–8.3)

## 2012-04-09 LAB — CBC
HCT: 46.1 % (ref 39.0–52.0)
Hemoglobin: 15.6 g/dL (ref 13.0–17.0)
MCH: 31.6 pg (ref 26.0–34.0)
MCV: 93.5 fL (ref 78.0–100.0)
Platelets: 196 10*3/uL (ref 150–400)
RBC: 4.93 MIL/uL (ref 4.22–5.81)
WBC: 6.8 10*3/uL (ref 4.0–10.5)

## 2012-04-09 NOTE — Progress Notes (Signed)
CMP,CBC AND PT DONE TODAY Corinne Goucher 

## 2012-04-09 NOTE — Telephone Encounter (Signed)
Called patient back yesterday afternoon. He will come in this AM for lab draw.

## 2012-04-13 ENCOUNTER — Ambulatory Visit: Payer: Medicaid Other

## 2012-04-13 ENCOUNTER — Telehealth: Payer: Self-pay | Admitting: *Deleted

## 2012-04-13 NOTE — Telephone Encounter (Signed)
Beverly with Dr. Ronie Spies office calling for our NPI.  Patient is scheduled to see them on 04/15/12 for shoulder pain.  I can not see where we have seen this patient or referred him for shoulder pain.  Claudette Head,  Mr. Arbutus Ped will need to see Dr. Armen Pickup first to address his shoulder pain.  He is scheduled to see Dr. Armen Pickup on 04/16/2012 to discuss allergist referral already.  Ileana Ladd

## 2012-04-16 ENCOUNTER — Encounter: Payer: Self-pay | Admitting: Family Medicine

## 2012-04-16 ENCOUNTER — Ambulatory Visit (INDEPENDENT_AMBULATORY_CARE_PROVIDER_SITE_OTHER): Payer: Medicaid Other | Admitting: Family Medicine

## 2012-04-16 DIAGNOSIS — H34812 Central retinal vein occlusion, left eye: Secondary | ICD-10-CM

## 2012-04-16 DIAGNOSIS — J309 Allergic rhinitis, unspecified: Secondary | ICD-10-CM

## 2012-04-16 MED ORDER — LORATADINE 10 MG PO TABS: 10.0000 mg | ORAL_TABLET | Freq: Every day | ORAL | Status: DC

## 2012-04-16 NOTE — Assessment & Plan Note (Signed)
A: persistent. Meds: non compliant with prescribed schedule. P: reassurance: no ENT or allergist referral needed at this time -switch zyrtec to claritin (daily) to decrease drowsiness -restart flonase
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
## 2012-04-16 NOTE — Assessment & Plan Note (Signed)
A: monitored by opthalmology. Screening labs for HLD, DM, and platelet/clotting dysfunction normal. Patient compliant with aspirin.

## 2012-04-16 NOTE — Patient Instructions (Signed)
Michael Fowler,   Please take the Claritin every day to treat and prevent allergy symptoms.  Please take Flonase.  All of your blood work was normal. Please take the copy with your to your follow up visit.   F/u with me if your allergies or shoulder pain worsens.   Dr. Armen Pickup

## 2012-06-29 ENCOUNTER — Ambulatory Visit (INDEPENDENT_AMBULATORY_CARE_PROVIDER_SITE_OTHER): Payer: Medicaid Other | Admitting: Family Medicine

## 2012-06-29 ENCOUNTER — Encounter: Payer: Self-pay | Admitting: Family Medicine

## 2012-06-29 VITALS — BP 120/83 | HR 57 | Temp 97.6°F | Ht 68.75 in | Wt 157.2 lb

## 2012-06-29 DIAGNOSIS — R252 Cramp and spasm: Secondary | ICD-10-CM

## 2012-06-29 MED ORDER — CYCLOBENZAPRINE HCL 10 MG PO TABS: 10.0000 mg | ORAL_TABLET | Freq: Every evening | ORAL | Status: AC | PRN

## 2012-06-29 MED ORDER — CYCLOBENZAPRINE HCL 10 MG PO TABS: 10.0000 mg | ORAL_TABLET | Freq: Three times a day (TID) | ORAL | Status: DC | PRN

## 2012-06-29 NOTE — Patient Instructions (Addendum)
Hodge,   Thank you for coming in today. Your pain is muscle cramps. For this please do the following: 1. Increase water to 3 bottles per night. 2. Stretch before bed as I demonstrated.  3. Continue ice or heath whichever works best.  4. You may use the flexeril (muscle relaxer) at night as needed.   F/u in 2 weeks if you are still having symptoms.  Dr. Armen Pickup

## 2012-06-29 NOTE — Progress Notes (Signed)
Subjective:     Patient ID: Michael Fowler, male   DOB: [birth year], 46 y.o.   MRN: [id number]  HPI 46 yo M presents for evaluation of two weeks of bilateral plantar surface foot pain and anterior lower leg pain. The symptoms occur at night. The pain is described as a tightness. The pain is slightly relieved with ice or heat or walking around. He is fasting daily for Ramadan. He eats a small meal after sunset and drinks about 32 oz of water nightly.  He denies similar symptoms previously, injuries, ankle or knee swelling, and calf pain.   Review of Systems As per HPI     Objective:   Physical Exam BP 120/83  Pulse 57  Temp 97.6 F (36.4 C) (Oral)  Ht 5' 8.75" (1.746 m)  Wt 157 lb 3.2 oz (71.305 kg)  BMI 23.38 kg/m2 General appearance: alert, cooperative and no distress Lower Extremities: no rash,  no tenderness, no joint swelling, normal  strength in ankle in all planes, no tenderness in foot, 2+ DP pulse bilaterally.   Assessment and Plan:

## 2012-06-29 NOTE — Assessment & Plan Note (Addendum)
A: muscle cramps in legs at night. Suspect dehydration given fast.  P:  -hold labs: until after Ramadan as not to break fast.  - Increase water to 3 bottles per night. -. Stretch before bed as I demonstrated.  -. Continue ice or heath whichever works best.  - You may use the flexeril (muscle relaxer) at night as needed.   F/u in 2 weeks if you are still having symptoms.

## 2012-12-23 ENCOUNTER — Ambulatory Visit (INDEPENDENT_AMBULATORY_CARE_PROVIDER_SITE_OTHER): Payer: Medicaid Other | Admitting: *Deleted

## 2012-12-23 DIAGNOSIS — Z23 Encounter for immunization: Secondary | ICD-10-CM

## 2013-03-09 ENCOUNTER — Other Ambulatory Visit: Payer: Self-pay | Admitting: *Deleted

## 2013-03-09 DIAGNOSIS — N400 Enlarged prostate without lower urinary tract symptoms: Secondary | ICD-10-CM

## 2013-03-10 MED ORDER — TAMSULOSIN HCL 0.4 MG PO CAPS: 0.4000 mg | ORAL_CAPSULE | Freq: Every day | ORAL | Status: DC

## 2013-07-11 ENCOUNTER — Encounter: Payer: Self-pay | Admitting: Family Medicine

## 2013-07-11 ENCOUNTER — Ambulatory Visit (INDEPENDENT_AMBULATORY_CARE_PROVIDER_SITE_OTHER): Payer: Medicaid Other | Admitting: Family Medicine

## 2013-07-11 VITALS — BP 147/74 | HR 73 | Temp 98.2°F | Wt 155.0 lb

## 2013-07-11 DIAGNOSIS — B9681 Helicobacter pylori [H. pylori] as the cause of diseases classified elsewhere: Secondary | ICD-10-CM

## 2013-07-11 DIAGNOSIS — R1013 Epigastric pain: Secondary | ICD-10-CM

## 2013-07-11 HISTORY — DX: Helicobacter pylori (H. pylori) as the cause of diseases classified elsewhere: B96.81

## 2013-07-11 LAB — POCT H PYLORI SCREEN: H Pylori Screen, POC: POSITIVE

## 2013-07-11 LAB — CBC
Hemoglobin: 14.9 g/dL (ref 13.0–17.0)
MCHC: 34.5 g/dL (ref 30.0–36.0)
Platelets: 217 10*3/uL (ref 150–400)
RBC: 4.78 MIL/uL (ref 4.22–5.81)

## 2013-07-11 MED ORDER — OMEPRAZOLE 40 MG PO CPDR: 40.0000 mg | DELAYED_RELEASE_CAPSULE | Freq: Every day | ORAL | Status: DC

## 2013-07-11 NOTE — Assessment & Plan Note (Signed)
A: epigastric pain. Normal exam. History consistent with gastritis, possibly H. Pylori, plus or minus constipation. Biliary colic, cholecystitis and pancreatitis are also possible but less likely given exam findings.  P: Check H. Pylori Check CBC, CMP, lipase Start PPI Avoid possible gastritis triggers: per AVS

## 2013-07-11 NOTE — Progress Notes (Signed)
Subjective:     Patient ID: Michael Fowler, male   DOB: [birth year], 47 y.o.   MRN: [id number]  HPI 47 yo M with history of GERD and constipation presents for same day visit with epigastric pain for the past 6 days. The pain started a night. No associated with eating. Initially the pain radiated towards the back as well but has not done this for the past 2 days. He admits to associated reflux and constipation. Denies associated fever, nausea, vomiting, diarrhea and blood in stools. Passing gas and taking tylenol improves the pain. The patient is Muslim, he fasted last month for Ramadan. He does not smoke or drink alcohol.   Review of Systems As per HPI     Objective:   Physical Exam BP 147/74  Pulse 73  Temp(Src) 98.2 F (36.8 C) (Oral)  Wt 155 lb (70.308 kg)  BMI 23.06 kg/m2 General appearance: alert, cooperative and no distress Eyes: conjunctivae/corneas clear.  Abdomen: soft, mild epigastric tenderness; bowel sounds normal; no masses,  no organomegaly, negative Murphy sign.  Back: no CVA tenderness    Assessment and Plan:

## 2013-07-11 NOTE — Patient Instructions (Addendum)
Michael Fowler,  Thank you for coming in today.  Your symptoms sound like gastritis, but it can also be biliary colic.  I am getting blood work to check your gallbladder, pancreas.   Please start omeprazole 30 minutes before dinner.  Avoid possible gastritis triggers: acidic foods (tomato, orange juice), caffeine.  Take tylenol for pain. If you develop vomiting or fever please call the clinic or after hours line.   Dr. Armen Pickup

## 2013-07-12 ENCOUNTER — Telehealth: Payer: Self-pay | Admitting: Family Medicine

## 2013-07-12 ENCOUNTER — Encounter: Payer: Self-pay | Admitting: Family Medicine

## 2013-07-12 DIAGNOSIS — R1013 Epigastric pain: Secondary | ICD-10-CM

## 2013-07-12 LAB — COMPLETE METABOLIC PANEL WITH GFR
ALT: 23 U/L (ref 0–53)
Albumin: 4.5 g/dL (ref 3.5–5.2)
Alkaline Phosphatase: 69 U/L (ref 39–117)
CO2: 30 mEq/L (ref 19–32)
GFR, Est Non African American: >89 mL/min
Glucose, Bld: 79 mg/dL (ref 70–99)
Potassium: 3.9 mEq/L (ref 3.5–5.3)
Sodium: 138 mEq/L (ref 135–145)
Total Protein: 6.9 g/dL (ref 6.0–8.3)

## 2013-07-12 MED ORDER — AMOXICILLIN 500 MG PO CAPS: 1000.0000 mg | ORAL_CAPSULE | Freq: Two times a day (BID) | ORAL | Status: DC

## 2013-07-12 MED ORDER — CLARITHROMYCIN 500 MG PO TABS: 500.0000 mg | ORAL_TABLET | Freq: Two times a day (BID) | ORAL | Status: DC

## 2013-07-12 MED ORDER — AMOXICILLIN 500 MG PO CAPS: 500.0000 mg | ORAL_CAPSULE | Freq: Two times a day (BID) | ORAL | Status: DC

## 2013-07-12 NOTE — Assessment & Plan Note (Signed)
A: H. Pylori positive P: Treat  1. Take omeprazole 40 mg twice daily for 10 days.  2. Take amoxicillin 1 gram by mouth twice daily for 10 days. 3. Take clarithromycin 500 mg by mouth twice daily for 10 days.

## 2013-07-12 NOTE — Telephone Encounter (Signed)
Called patient at home left VM.   H. Pylori positive.  All other labs normal. Results letter with treatment plan sent.   H. Pylori is a bacteria that causes gastritis and stomach ulcers. For this please do the following:   1. Take omeprazole 40 mg twice daily for 10 days.  2. Take amoxicillin 1 gram by mouth twice daily for 10 days. 3. Take clarithromycin 500 mg by mouth twice daily for 10 days.  I have sent the two antibiotics amoxicillin and clarithromycin to your pharmacy. [facility] on [address]  I sent omeprazole yesterday.   Called patients pharmacy to correct amoxicillin dose 1 gm BID, not 500 mg BID x 10 days,

## 2013-07-18 ENCOUNTER — Ambulatory Visit (INDEPENDENT_AMBULATORY_CARE_PROVIDER_SITE_OTHER): Payer: Medicaid Other | Admitting: Family Medicine

## 2013-07-18 ENCOUNTER — Encounter: Payer: Self-pay | Admitting: Family Medicine

## 2013-07-18 ENCOUNTER — Ambulatory Visit
Admission: RE | Admit: 2013-07-18 | Discharge: 2013-07-18 | Disposition: A | Discharge status: Home / Self Care | Payer: Medicaid Other | Source: Ambulatory Visit | Attending: Family Medicine | Admitting: Family Medicine

## 2013-07-18 ENCOUNTER — Telehealth: Payer: Self-pay | Admitting: Family Medicine

## 2013-07-18 VITALS — BP 135/96 | HR 99 | Temp 98.4°F | Ht 68.75 in | Wt 153.0 lb

## 2013-07-18 DIAGNOSIS — R1013 Epigastric pain: Secondary | ICD-10-CM

## 2013-07-18 DIAGNOSIS — M549 Dorsalgia, unspecified: Secondary | ICD-10-CM | POA: Insufficient documentation

## 2013-07-18 MED ORDER — POLYETHYLENE GLYCOL 3350 17 GM/SCOOP PO POWD: 17.0000 g | Freq: Every day | ORAL | Status: DC

## 2013-07-18 MED ORDER — SUCRALFATE 1 GM/10ML PO SUSP: 1.0000 g | Freq: Four times a day (QID) | ORAL | Status: DC

## 2013-07-18 NOTE — Progress Notes (Signed)
Patient ID: Michael Fowler    DOB: 10-Jul-1966, 47 y.o.   MRN: [id number] --- Subjective:  Michael Fowler is a 47 y.o.male who presents for follow up on epigastric abdominal pain.  - has been present for 2 weeks. He was evaluated on 8/12 for epigastric pain and found to have H-pylori. He was started on antibiotics and omeprazole. He states his symptoms have mildly improved but he continues to have pain that keeps him up at night. He was unable to sleep due to the pain and came to be evaluated today.  Pain is epigastric, achy in nature, constant. Occurs a little while after eating, worst after he has gone to bed. No nausea, no vomiting. He has tried tylenol for the pain which has not helped.  He has decreased appetite and has not been eating well. He states that going over bumps in the road makes the pain worst.  Last bowel movement was 3 days ago. It was soft, non bloody, non melanous. He does strain with bowel movement. He states feeling gaseous. Last bowel movement was this morning, was small and loose.    - low back pain: located on right and left sides. Present for 2 days. Worst with movement. Using icy hot which helps. No weakness, no urine or bowel incontinence.   ROS: see HPI Past Medical History: reviewed and updated medications and allergies. Social History: Tobacco: former smoker  Objective: Filed Vitals:   07/18/13 1003  BP: 135/96  Pulse: 99  Temp: 98.4 F (36.9 C)    Physical Examination:   General appearance - alert, well appearing, and in no distress, very pleasant and polite gentleman.  Chest - clear to auscultation, no wheezes, rales or rhonchi, symmetric air entry Heart - normal rate, regular rhythm, normal S1, S2, no murmurs, rubs, clicks or gallops Abdomen - soft, tender to palpation in epigastric region, negative Murphy's, no rebound tenderness, no rigidity. Some mild discomfort with jumping.  Back - no spinal tenderness, tenderness to palpation along paraspinal muscles on right  side of thoracic spine as well as right and left sides of lumbar spine.

## 2013-07-18 NOTE — Assessment & Plan Note (Signed)
Currently being treated for H-pylori with some relief, but still having symptoms keeping him up at night.  Differential includes peptic ulcer vs duodenal ulcer vs biliary colic.  With description of peritoneal signs (pain with bumps in the car), will obtain abdominal xray to rule out free air.  Otherwise, will add sucralfate for possible symptom relief. Patient has symptoms of constipation as well which could be contributing to the pain. Patient to take mirlax daily.  Follow up with Dr. Armen Pickup after he has completed course of therapy to monitor for resolution of symptoms.

## 2013-07-18 NOTE — Patient Instructions (Addendum)
For the constipation, take miralax once daily.   To help with the ulcer pain, take sucralfate on an empty stomach. Continue taking the omeprazole and the antibiotics.  I will call you if anything showed on the xray.  Return to the clinic or to the ED if you have worsening pain, fevers, chills, nausea or vomiting or blood in your stool.   Follow up with Dr. Armen Pickup when you finish the course of antibiotics.

## 2013-07-18 NOTE — Telephone Encounter (Signed)
Called patient to let him know that the abdominal xray showed stool in the colon but no free air or other emergent findings. He states that he tried the carafate today and that he has had relief of his stomach pain.  I reiterated the importance of him following up with Dr. Armen Pickup after he finished course of H-pylori treatment. He expressed understanding and agreed with plan.   Marena Chancy, PGY-3 Family Medicine Resident

## 2013-07-18 NOTE — Assessment & Plan Note (Signed)
Muscular in nature with increased tension in paraspinal muscles. Recommended icy hot and massage therapy. Return if worsening or non resolution of symptoms.

## 2013-07-20 ENCOUNTER — Encounter (HOSPITAL_COMMUNITY): Payer: Self-pay

## 2013-07-20 ENCOUNTER — Telehealth: Payer: Self-pay | Admitting: Family Medicine

## 2013-07-20 ENCOUNTER — Emergency Department (HOSPITAL_COMMUNITY): Payer: Medicaid Other

## 2013-07-20 ENCOUNTER — Emergency Department (HOSPITAL_COMMUNITY)
Admission: EM | Admit: 2013-07-20 | Discharge: 2013-07-20 | Disposition: A | Discharge status: Home / Self Care | Payer: Medicaid Other | Attending: Emergency Medicine | Admitting: Emergency Medicine

## 2013-07-20 ENCOUNTER — Other Ambulatory Visit: Payer: Self-pay

## 2013-07-20 ENCOUNTER — Emergency Department (INDEPENDENT_AMBULATORY_CARE_PROVIDER_SITE_OTHER)
Admission: EM | Admit: 2013-07-20 | Discharge: 2013-07-20 | Disposition: A | Discharge status: Other Acute Inpatient Hospital | Payer: Medicaid Other | Source: Home / Self Care | Attending: Family Medicine | Admitting: Family Medicine

## 2013-07-20 DIAGNOSIS — E860 Dehydration: Secondary | ICD-10-CM | POA: Insufficient documentation

## 2013-07-20 DIAGNOSIS — Z79899 Other long term (current) drug therapy: Secondary | ICD-10-CM | POA: Insufficient documentation

## 2013-07-20 DIAGNOSIS — Z792 Long term (current) use of antibiotics: Secondary | ICD-10-CM | POA: Insufficient documentation

## 2013-07-20 DIAGNOSIS — R5381 Other malaise: Secondary | ICD-10-CM

## 2013-07-20 DIAGNOSIS — Z8669 Personal history of other diseases of the nervous system and sense organs: Secondary | ICD-10-CM | POA: Insufficient documentation

## 2013-07-20 DIAGNOSIS — R42 Dizziness and giddiness: Secondary | ICD-10-CM

## 2013-07-20 DIAGNOSIS — R531 Weakness: Secondary | ICD-10-CM

## 2013-07-20 DIAGNOSIS — Z8781 Personal history of (healed) traumatic fracture: Secondary | ICD-10-CM | POA: Insufficient documentation

## 2013-07-20 DIAGNOSIS — Z87828 Personal history of other (healed) physical injury and trauma: Secondary | ICD-10-CM | POA: Insufficient documentation

## 2013-07-20 DIAGNOSIS — Z87891 Personal history of nicotine dependence: Secondary | ICD-10-CM | POA: Insufficient documentation

## 2013-07-20 DIAGNOSIS — R51 Headache: Secondary | ICD-10-CM | POA: Insufficient documentation

## 2013-07-20 LAB — COMPREHENSIVE METABOLIC PANEL
ALT: 21 U/L (ref 0–53)
Albumin: 3.8 g/dL (ref 3.5–5.2)
Alkaline Phosphatase: 87 U/L (ref 39–117)
BUN: 9 mg/dL (ref 6–23)
Calcium: 9.2 mg/dL (ref 8.4–10.5)
Potassium: 3.2 mEq/L — ABNORMAL LOW (ref 3.5–5.1)
Sodium: 135 mEq/L (ref 135–145)
Total Protein: 7.8 g/dL (ref 6.0–8.3)

## 2013-07-20 LAB — POCT I-STAT, CHEM 8
Calcium, Ion: 1.11 mmol/L — ABNORMAL LOW (ref 1.12–1.23)
Glucose, Bld: 110 mg/dL — ABNORMAL HIGH (ref 70–99)
HCT: 49 % (ref 39.0–52.0)
Hemoglobin: 16.7 g/dL (ref 13.0–17.0)
Potassium: 3.5 mEq/L (ref 3.5–5.1)

## 2013-07-20 LAB — URINALYSIS, ROUTINE W REFLEX MICROSCOPIC
Bilirubin Urine: NEGATIVE
Ketones, ur: 40 mg/dL — AB
Nitrite: NEGATIVE
Protein, ur: NEGATIVE mg/dL
Urobilinogen, UA: 1 mg/dL (ref 0.0–1.0)

## 2013-07-20 LAB — CBC WITH DIFFERENTIAL/PLATELET
Basophils Relative: 0 % (ref 0–1)
Eosinophils Absolute: 0 10*3/uL (ref 0.0–0.7)
MCH: 31.7 pg (ref 26.0–34.0)
MCHC: 35.9 g/dL (ref 30.0–36.0)
Neutrophils Relative %: 69 % (ref 43–77)
Platelets: 242 10*3/uL (ref 150–400)

## 2013-07-20 LAB — LIPASE, BLOOD: Lipase: 37 U/L (ref 11–59)

## 2013-07-20 MED ORDER — SODIUM CHLORIDE 0.9 % IV BOLUS (SEPSIS)
1000.0000 mL | Freq: Once | INTRAVENOUS | Status: AC
  Administered 2013-07-20: 1000 mL via INTRAVENOUS

## 2013-07-20 MED ORDER — KETOROLAC TROMETHAMINE 30 MG/ML IJ SOLN
30.0000 mg | Freq: Once | INTRAMUSCULAR | Status: AC
  Administered 2013-07-20: 30 mg via INTRAVENOUS
  Filled 2013-07-20: qty 1

## 2013-07-20 NOTE — Telephone Encounter (Signed)
Pt is calling to talk to a nurse about the symptoms he is having not eating, nausea, dizzy. JW
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
## 2013-07-20 NOTE — ED Notes (Signed)
Pt c/o dizziness, weakness, nausea and Right side headache x2 days. Pt sent here from UC for further evaluation. No neuro deficits noted, no facial droop, no arm drift, grips and strengths are equal bilaterally, no slurred speech

## 2013-07-20 NOTE — ED Notes (Signed)
Was seen 8-18 [facility] for c/o being dizy and weak, c/o still feels weak and dizzy, no energy, right sided HA

## 2013-07-20 NOTE — Telephone Encounter (Signed)
Returned call to pt - pt not available, left message to call clinic back if needed. Wyatt Haste, RN-BSN

## 2013-07-20 NOTE — Telephone Encounter (Signed)
Pt reports dizziness, right side head pain and weakness, not wanting to eat - increasing pain throughout day - suggested that pt go to ED to seek further immediate eval (did not want to go "takes too long, too much"), recommended Urgent Care - since no appointments available today or tomorrow and pt needs immediate further eval - pt agreed to seek tx at [facility]. Wyatt Haste, RN-BSN

## 2013-07-21 ENCOUNTER — Ambulatory Visit: Payer: Medicaid Other | Admitting: Family Medicine

## 2013-08-02 ENCOUNTER — Ambulatory Visit (INDEPENDENT_AMBULATORY_CARE_PROVIDER_SITE_OTHER): Payer: Medicaid Other | Admitting: Family Medicine

## 2013-08-02 ENCOUNTER — Encounter: Payer: Self-pay | Admitting: Family Medicine

## 2013-08-02 VITALS — BP 120/83 | HR 75 | Temp 98.6°F | Ht 70.0 in | Wt 152.8 lb

## 2013-08-02 DIAGNOSIS — E860 Dehydration: Secondary | ICD-10-CM

## 2013-08-02 DIAGNOSIS — R1013 Epigastric pain: Secondary | ICD-10-CM

## 2013-08-02 DIAGNOSIS — Z23 Encounter for immunization: Secondary | ICD-10-CM

## 2013-08-02 NOTE — Progress Notes (Signed)
Subjective:     Patient ID: Michael Fowler, male   DOB: October 02, 1966, 47 y.o.   MRN: [id number]  HPI Patient seen for f/u of ED visit. He was seen in the ED with headache, lightheadedness and GI upset. He was found to be dehydrated with slightly low potassium. He received IV fluids and improved. He reports persistent intermittent GI upset with sharp pains that radiate up to the substernal area and L chest. Symptoms occur at night. Last <  1 min. Occur at rest.   Regarding his H. Pylori infection. He stopped antibiotics with two days of pills left. He also stopped omeprazole because he felt that it made him gassy. He denies N/V/D/melena. He drinks Naked smoothies which have improved his bowel regimen, no longer constipated.    Review of Systems As per HPI     Objective:   Physical Exam BP 120/83  Pulse 75  Temp(Src) 98.6 F (37 C) (Oral)  Ht 5\' 10"  (1.778 m)  Wt 152 lb 12.8 oz (69.31 kg)  BMI 21.92 kg/m2 General appearance: alert, cooperative and no distress Throat: lips, mucosa, and tongue normal; teeth and gums normal Abdomen: soft, non-tender; bowel sounds normal; no masses,  no organomegaly    Assessment and Plan:

## 2013-08-02 NOTE — Patient Instructions (Addendum)
Laquinton,  Thank you for coming in today. My staff will be in touch with blood work results. Please finish your antibiotics for H. Pylori.  Please try Tums or Maalox for heartburn. If are have to take them more than 3 times per week. Call me and I will prescribe ad different reflux medication called zantac.   Dr. Armen Pickup

## 2013-08-03 LAB — BASIC METABOLIC PANEL
CO2: 32 mEq/L (ref 19–32)
Chloride: 100 mEq/L (ref 96–112)
Creat: 0.92 mg/dL (ref 0.50–1.35)
Potassium: 3.5 mEq/L (ref 3.5–5.3)

## 2013-08-03 NOTE — Assessment & Plan Note (Signed)
A: resolved. Repeat K normal on f/u BMP.

## 2013-08-03 NOTE — Assessment & Plan Note (Addendum)
A: improved symptomatically P: Advised patient to  Complete antibiotic regimen Take tums, mylanta or  PPI prn or if PPI worsens bloating, call and I will prescribe H2 blocker

## 2013-12-29 ENCOUNTER — Ambulatory Visit: Payer: Medicaid Other | Admitting: Family Medicine

## 2014-01-02 ENCOUNTER — Ambulatory Visit (INDEPENDENT_AMBULATORY_CARE_PROVIDER_SITE_OTHER): Payer: Medicaid Other | Admitting: Family Medicine

## 2014-01-02 ENCOUNTER — Encounter: Payer: Self-pay | Admitting: Family Medicine

## 2014-01-02 VITALS — BP 119/85 | HR 71 | Temp 98.0°F | Ht 68.75 in | Wt 157.0 lb

## 2014-01-02 DIAGNOSIS — B9681 Helicobacter pylori [H. pylori] as the cause of diseases classified elsewhere: Secondary | ICD-10-CM

## 2014-01-02 DIAGNOSIS — K219 Gastro-esophageal reflux disease without esophagitis: Secondary | ICD-10-CM

## 2014-01-02 DIAGNOSIS — A048 Other specified bacterial intestinal infections: Secondary | ICD-10-CM

## 2014-01-02 DIAGNOSIS — G44209 Tension-type headache, unspecified, not intractable: Secondary | ICD-10-CM | POA: Insufficient documentation

## 2014-01-02 DIAGNOSIS — K279 Peptic ulcer, site unspecified, unspecified as acute or chronic, without hemorrhage or perforation: Secondary | ICD-10-CM

## 2014-01-02 DIAGNOSIS — N529 Male erectile dysfunction, unspecified: Secondary | ICD-10-CM | POA: Insufficient documentation

## 2014-01-02 MED ORDER — TADALAFIL 20 MG PO TABS: 10.0000 mg | ORAL_TABLET | ORAL | Status: DC | PRN

## 2014-01-02 NOTE — Progress Notes (Signed)
   Subjective:    Patient ID: Shelby MattocksSufi Mohamed, male    DOB: 11/18/1966, 48 y.o.   MRN: [id number]  HPI 48 yo M presents for SD visit:  1. Headache: b/l frontal associated with feeling ligtheaded. Precipitated by stress. Recently [place] in laws from [place]. Stress regarding son's new bride who has informed the family that she no longer wants to be married.There was no associated fever, vision changes, nausea, weakness. Headache have resolved.   2. Erectile dysfunction: x 6-12 months. Able to obtain and maintain erection. Erections is not a strong a previous. Admits to increase stress as per above.   Review of Systems As per HPI  Denies heartburn and GI upset     Objective:   Physical Exam BP 119/85  Pulse 71  Temp(Src) 98 F (36.7 C) (Oral)  Ht 5' 8.75" (1.746 m)  Wt 157 lb (71.215 kg)  BMI 23.36 kg/m2 Negative orthostatic vital signs.  Head: Normocephalic, without obvious abnormality, atraumatic Eyes: conjunctivae/corneas clear. PERRL, EOM's intact. Fundi benign. Neck: no adenopathy, no carotid bruit, no JVD, supple, symmetrical, trachea midline and thyroid not enlarged, symmetric, no tenderness/mass/nodules Lungs: clear to auscultation bilaterally Heart: regular rate and rhythm, S1, S2 normal, no murmur, click, rub or gallop Neurologic: Alert and oriented X 3, normal strength and tone. Normal symmetric reflexes. Normal coordination and gait     Assessment & Plan:

## 2014-01-02 NOTE — Assessment & Plan Note (Signed)
A: induced by stress. No resolved. Normal VS, neuro exam and vision screen. P: reassurance.

## 2014-01-02 NOTE — Assessment & Plan Note (Signed)
A: improved with H pylori treatment.  P: resolve problem  

## 2014-01-02 NOTE — Assessment & Plan Note (Signed)
A: stress induced. Patient with hx of BPH P; Trial of cialis. Cautioned regarding hypotension with combo of cialis and flomax. Will consider daily low dose cialis if her tolerates it well.

## 2014-01-02 NOTE — Patient Instructions (Signed)
Bernarr,  Thank you for coming in today.  I am happy to hear that your headache has resolved. I agree that tension headache is most likely.  Normal blood pressure, vision testing and neurological exam today.   Regarding difficulty with erection: try cialsis 1/2 tab.   F/u in April for physical  Dr. Armen PickupFunches   Tadalafil tablets (Cialis) What is this medicine? TADALAFIL (tah DA la fil) is used to treat erection problems in men. It is also used for enlargement of the prostate gland in men, a condition called benign prostatic hyperplasia or BPH. This medicine improves urine flow and reduces BPH symptoms. This medicine can also treat both erection problems and BPH when they occur together. This medicine may be used for other purposes; ask your health care provider or pharmacist if you have questions. COMMON BRAND NAME(S): Cialis What should I tell my health care provider before I take this medicine? They need to know if you have any of these conditions: -bleeding disorders -eye or vision problems, including a rare inherited eye disease called retinitis pigmentosa -anatomical deformation of the penis, Peyronie's disease, or history of priapism (painful and prolonged erection) -heart disease, angina, a history of heart attack, irregular heart beats, or other heart problems -high or low blood pressure -history of blood diseases, like sickle cell anemia or leukemia -history of stomach bleeding -kidney disease -liver disease -stroke -an unusual or allergic reaction to tadalafil, other medicines, foods, dyes, or preservatives -pregnant or trying to get pregnant -breast-feeding How should I use this medicine? Take this medicine by mouth with a glass of water. Follow the directions on the prescription label. You may take this medicine with or without meals. When this medicine is used for erection problems, your doctor may prescribe it to be taken once daily or as needed. If you are taking the  medicine as needed, you may be able to have sexual activity 30 minutes after taking it and for up to 36 hours after taking it. Whether you are taking the medicine as needed or once daily, you should not take more than one dose per day. If you are taking this medicine for symptoms of benign prostatic hyperplasia (BPH) or to treat both BPH and an erection problem, take the dose once daily at about the same time each day. Do not take your medicine more often than directed. Talk to your pediatrician regarding the use of this medicine in children. Special care may be needed. Overdosage: If you think you have taken too much of this medicine contact a poison control center or emergency room at once. NOTE: This medicine is only for you. Do not share this medicine with others. What if I miss a dose? If you are taking this medicine as needed for erection problems, this does not apply. If you miss a dose while taking this medicine once daily for an erection problem, benign prostatic hyperplasia, or both, take it as soon as you remember, but do not take more than one dose per day. What may interact with this medicine? Do not take this medicine with any of the following medications: -nitrates like amyl nitrite, isosorbide dinitrate, isosorbide mononitrate, nitroglycerin -other medicines for erectile dysfunction like avanafil, sildenafil, vardenafil -other tadalafil products (Adcirca)  This medicine may also interact with the following medications: -certain drugs for high blood pressure -certain drugs for the treatment of HIV infection or AIDS -certain drugs used for fungal or yeast infections, like fluconazole, itraconazole, ketoconazole, and voriconazole -certain drugs used for  seizures like carbamazepine, phenytoin, and phenobarbital -grapefruit juice -macrolide antibiotics like clarithromycin, erythromycin, troleandomycin -medicines for prostate problems -rifabutin, rifampin or rifapentine This list may  not describe all possible interactions. Give your health care provider a list of all the medicines, herbs, non-prescription drugs, or dietary supplements you use. Also tell them if you smoke, drink alcohol, or use illegal drugs. Some items may interact with your medicine. What should I watch for while using this medicine? If you notice any changes in your vision while taking this drug, call your doctor or health care professional as soon as possible. Stop using this medicine and call your health care provider right away if you have a loss of sight in one or both eyes. Contact your doctor or health care professional right away if the erection lasts longer than 4 hours or if it becomes painful. This may be a sign of serious problem and must be treated right away to prevent permanent damage. If you experience symptoms of nausea, dizziness, chest pain or arm pain upon initiation of sexual activity after taking this medicine, you should refrain from further activity and call your doctor or health care professional as soon as possible. Do not drink alcohol to excess (examples, 5 glasses of wine or 5 shots of whiskey) when taking this medicine. When taken in excess, alcohol can increase your chances of getting a headache or getting dizzy, increasing your heart rate or lowering your blood pressure. Using this medicine does not protect you or your partner against HIV infection (the virus that causes AIDS) or other sexually transmitted diseases. What side effects may I notice from receiving this medicine? Side effects that you should report to your doctor or health care professional as soon as possible: -allergic reactions like skin rash, itching or hives, swelling of the face, lips, or tongue -breathing problems -changes in hearing -changes in vision -chest pain -fast, irregular heartbeat -prolonged or painful erection -seizures  Side effects that usually do not require medical attention (report to your  doctor or health care professional if they continue or are bothersome): -back pain -dizziness -flushing -headache -indigestion -muscle aches -nausea -stuffy or runny nose This list may not describe all possible side effects. Call your doctor for medical advice about side effects. You may report side effects to FDA at 1-800-FDA-1088. Where should I keep my medicine? Keep out of the reach of children. Store at room temperature between 15 and 30 degrees C (59 and 86 degrees F). Throw away any unused medicine after the expiration date. NOTE: This sheet is a summary. It may not cover all possible information. If you have questions about this medicine, talk to your doctor, pharmacist, or health care provider.  2014, [employer]vier/Gold Standard. (2012-11-17 13:42:23)

## 2014-01-02 NOTE — Assessment & Plan Note (Signed)
A: improved with H pylori treatment.  P: resolve problem

## 2014-01-14 ENCOUNTER — Encounter (HOSPITAL_COMMUNITY): Payer: Self-pay | Admitting: Emergency Medicine

## 2014-01-14 ENCOUNTER — Emergency Department (INDEPENDENT_AMBULATORY_CARE_PROVIDER_SITE_OTHER)
Admission: EM | Admit: 2014-01-14 | Discharge: 2014-01-14 | Disposition: A | Discharge status: Home / Self Care | Payer: Medicaid Other | Source: Home / Self Care | Attending: Family Medicine | Admitting: Family Medicine

## 2014-01-14 DIAGNOSIS — J069 Acute upper respiratory infection, unspecified: Secondary | ICD-10-CM

## 2014-01-14 LAB — POCT RAPID STREP A: STREPTOCOCCUS, GROUP A SCREEN (DIRECT): NEGATIVE

## 2014-01-14 MED ORDER — IPRATROPIUM BROMIDE 0.03 % NA SOLN: 2.0000 | Freq: Three times a day (TID) | NASAL | Status: DC

## 2014-01-14 NOTE — ED Provider Notes (Signed)
CSN: [id number]     Arrival date & time 01/14/14  1204 History   First MD Initiated Contact with Patient 01/14/14 1246     Chief Complaint  Patient presents with  . Sore Throat     (Consider location/radiation/quality/duration/timing/severity/associated sxs/prior Treatment) HPI Comments: Patient presents to report 2-3 days of hoarse voice with scratchy sore throat and post nasal drainage. States he went to his local pharmacy and began using OTC antihistamine/decongestant and has had only limited relief. Denies fever, cough, rhinorrhea, N/V/D. Patient is non-smoker  Patient is a 48 y.o. male presenting with pharyngitis. The history is provided by the patient.  Sore Throat    Past Medical History  Diagnosis Date  . Dizziness - light-headed 09/01/2011  . BACK STRAIN, LUMBAR 02/16/2009    Qualifier: Diagnosis of  By: Clelia CroftShaw MD, Kimberlee    . INGROWN TOENAIL 04/09/2007    Qualifier: Diagnosis of  By: Erenest RasherVANSTORY MD, MADELEINE    . Clavicular fracture 2003    Sustained during [facility]  . H pylori ulcer 07/11/2013  . GERD (gastroesophageal reflux disease) 04/29/2011   History reviewed. No pertinent past surgical history. No family history on file. History  Substance Use Topics  . Smoking status: Former Smoker    Types: Cigarettes    Quit date: 01/16/2005  . Smokeless tobacco: Never Used  . Alcohol Use: Not on file    Review of Systems  All other systems reviewed and are negative.      Allergies  Review of patient's allergies indicates no known allergies.  Home Medications   Current Outpatient Rx  Name  Route  Sig  Dispense  Refill  . tadalafil (CIALIS) 20 MG tablet   Oral   Take 0.5-1 tablets (10-20 mg total) by mouth every other day as needed for erectile dysfunction.   5 tablet   11   . tamsulosin (FLOMAX) 0.4 MG CAPS   Oral   Take 1 capsule (0.4 mg total) by mouth daily.   30 capsule   11     Patient needs to call the office and schedule an a ...    BP 133/97  Pulse  65  Temp(Src) 98.5 F (36.9 C) (Oral)  Resp 16  SpO2 99% Physical Exam  Nursing note and vitals reviewed. Constitutional: He appears well-developed and well-nourished. No distress.  HENT:  Head: Normocephalic and atraumatic.  Right Ear: Hearing, tympanic membrane, external ear and ear canal normal.  Left Ear: Hearing, tympanic membrane, external ear and ear canal normal.  Nose: Nose normal.  Mouth/Throat: Uvula is midline and mucous membranes are normal. Posterior oropharyngeal erythema present. No oropharyngeal exudate, posterior oropharyngeal edema or tonsillar abscesses.  Mild cobblestoning of posterior pharynx  Eyes: Conjunctivae are normal.  Cardiovascular: Normal rate, regular rhythm and normal heart sounds.   Pulmonary/Chest: Effort normal and breath sounds normal.    ED Course  Procedures (including critical care time) Labs Review Labs Reviewed - No data to display Imaging Review No results found.    MDM   Final diagnoses:  None  Rapid strep negative. Will try adding atrovent nasal spray to help reduce post nasal drainage in hopes of improving throat irritation. Advise follow up with PCP if no improvement.   Jess BartersJennifer Lee DeadwoodPresson, [place] 01/14/14 1319

## 2014-01-14 NOTE — ED Provider Notes (Signed)
Medical screening examination/treatment/procedure(s) were performed by resident physician or non-physician practitioner and as supervising physician I was immediately available for consultation/collaboration.   Kenijah Benningfield DOUGLAS MD.   Ishia Tenorio D Tanja Gift, MD 01/14/14 1720 

## 2014-01-14 NOTE — Discharge Instructions (Signed)
Your test for strep throat was negative.  °

## 2014-01-14 NOTE — ED Notes (Signed)
Pt c/o sore throat onset 5 days sxs include pain to swallow Denies cold sxs, f/v/n/d Taking OTC cold meds w/no relief Alert w/no signs of acute distress

## 2014-01-16 LAB — CULTURE, GROUP A STREP

## 2014-01-18 ENCOUNTER — Encounter: Payer: Self-pay | Admitting: *Deleted

## 2014-01-24 ENCOUNTER — Encounter: Payer: Self-pay | Admitting: Family Medicine

## 2014-01-24 ENCOUNTER — Ambulatory Visit (INDEPENDENT_AMBULATORY_CARE_PROVIDER_SITE_OTHER): Payer: Medicaid Other | Admitting: Family Medicine

## 2014-01-24 VITALS — BP 153/87 | HR 71 | Temp 98.1°F | Wt 159.0 lb

## 2014-01-24 DIAGNOSIS — R51 Headache: Secondary | ICD-10-CM

## 2014-01-24 DIAGNOSIS — I10 Essential (primary) hypertension: Secondary | ICD-10-CM

## 2014-01-24 DIAGNOSIS — G44209 Tension-type headache, unspecified, not intractable: Secondary | ICD-10-CM

## 2014-01-24 LAB — BASIC METABOLIC PANEL
BUN: 10 mg/dL (ref 6–23)
CALCIUM: 8.9 mg/dL (ref 8.4–10.5)
CO2: 32 mEq/L (ref 19–32)
Chloride: 99 mEq/L (ref 96–112)
Creat: 0.8 mg/dL (ref 0.50–1.35)
Glucose, Bld: 80 mg/dL (ref 70–99)
POTASSIUM: 3.7 meq/L (ref 3.5–5.3)
SODIUM: 135 meq/L (ref 135–145)

## 2014-01-24 LAB — LIPID PANEL
CHOL/HDL RATIO: 5.5 ratio
Cholesterol: 262 mg/dL — ABNORMAL HIGH (ref 0–200)
HDL: 48 mg/dL (ref >39)
LDL CALC: 187 mg/dL — AB (ref 0–99)
Triglycerides: 135 mg/dL (ref <150)
VLDL: 27 mg/dL (ref 0–40)

## 2014-01-24 MED ORDER — HYDROCHLOROTHIAZIDE 12.5 MG PO CAPS: 12.5000 mg | ORAL_CAPSULE | Freq: Every day | ORAL | Status: DC

## 2014-01-24 NOTE — Patient Instructions (Signed)
Michael Fowler,   Thank you for coming in today. Your headache is tension type. Tension type headache in the setting of elevated BP, hypertension is best treated by treating the blood pressure with low salt diet and medication.   Please start HCTZ 12.5 mg daily. New BP medication.  Checking electrolytes and cholesterol today.  See me again in 3-4 weeks.   Dr. Armen PickupFunches   1.5 Gram Low Sodium Diet A 1.5 gram sodium diet restricts the amount of salt in your diet. You can have no more than 1.5 grams (1500 miligrams) in 1 day. This can help lessen your risk for developing high blood pressure. This diet may also reduce your chance of having a heart attack or stroke. It is important that you know what to look for when choosing foods and drinks.  HOME CARE   Do not add salt to food.  Avoid convenience items and fast food.  Choose unsalted snack foods.  Buy products labeled "low sodium" or "no salt added" when possible.  Check food labels to learn how much sodium is in 1 serving.  When eating at a restaurant, ask that your food be prepared with less salt or none, if possible. The nutrition facts label is a good place to find how much sodium is in foods. Look for products with no more than 400 mg of sodium per serving. Remember that 1.5 g = 1500 mg. CHOOSING FOODS Grains  Avoid: Salted crackers and snack items. Some cereals, including instant hot cereals. Bread stuffing and biscuit mixes. Seasoned rice or pasta mixes.  Choose: Unsalted snack items. Low-sodium cereals, oats, puffed wheat and rice, shredded wheat. English muffins and bread. Pasta. Meats  Avoid:  Salted, canned, smoked, spiced, pickled meats, including fish and poultry. Bacon, ham, sausage, cold cuts, hot dogs, anchovies.  Choose: Low-sodium canned tuna and salmon. Fresh or frozen meat, poultry, and fish. Dairy  Avoid: Processed cheese and spreads. Cottage cheese. Buttermilk and condensed milk. Regular cheese.  Choose:   Milk. Low-sodium cottage cheese. Yogurt. Sour cream. Low-sodium cheese. Fruits and Vegetables  Avoid:  Regular canned vegetables. Regular canned tomato sauce and paste. Frozen vegetables in sauces. Olives. [name]. Relishes. Sauerkraut.  Choose:  Low-sodium canned vegetables. Low-sodium tomato sauce and paste. Frozen or fresh vegetables. Fresh and frozen fruit. Condiments  Avoid:  Canned and packaged gravies. Worcestershire sauce. Tartar sauce. Barbecue sauce. Soy sauce. Steak sauce. Ketchup. Onion, garlic, and table salt. Meat flavorings and tenderizers.  Choose:  Fresh and dried herbs and spices. Low-sodium varieties of mustard and ketchup. Lemon juice. Tabasco sauce. Horseradish. Document Released: 12/20/2010 Document Revised: 02/09/2012 Document Reviewed: 12/20/2010 [facility] Patient Information 2014 [place], [place].

## 2014-01-24 NOTE — Assessment & Plan Note (Signed)
Your headache is tension type. Tension type headache in the setting of elevated BP, hypertension is best treated by treating the blood pressure with low salt diet and medication.   Please start HCTZ 12.5 mg daily. New BP medication.  Checking electrolytes and cholesterol today.  See me again in 3-4 weeks.

## 2014-01-24 NOTE — Assessment & Plan Note (Signed)
A: patient is describing tension headache in the setting of elevated BP P:  Treated BP with HCTZ 12.5 mg daily Check BMP Check lipid panel  F/u BP in 4 weeks

## 2014-01-24 NOTE — Progress Notes (Signed)
   Subjective:    Patient ID: Michael MattocksSufi Mohamed, male    DOB: 09/21/1966, 48 y.o.   MRN: [id number]  HPI 48 yo M presents for f/u visit to discuss the following:  1. Headache: x one week. B/l temple. Comes and goes. Worse with talking on the phone for a long time. Better with prayer and meditation. Also better with aleve. Pain is 6-7/10. Described as tightness and fullness. Associated with buzzing in ears. No vision changes, nausea, weakness, CP or SOB.   2. R first MCP pain: pain at 1st MCP. No swelling or edema. Pain radiates proximally toward volar wrist. No pain now.   Soc Hx: non-smoker   Review of Systems As per HPI     Objective:   Physical Exam BP 153/87  Pulse 71  Temp(Src) 98.1 F (36.7 C) (Oral)  Wt 159 lb (72.122 kg) General appearance: alert, cooperative and no distress Head: Normocephalic, without obvious abnormality, atraumatic Eyes: conjunctivae/corneas clear. PERRL, EOM's intact. Fundi benign. Ears: normal TM's and external ear canals both ears Lungs: clear to auscultation bilaterally Heart: regular rate and rhythm, S1, S2 normal, no murmur, click, rub or gallop Extremities: extremities normal, atraumatic, no cyanosis or edema RUE: negative finkelstein. Full ROM and strength at wrist and 1st digit. No joint swelling or erythema.  Neurologic: Alert and oriented X 3, normal strength and tone. Normal symmetric reflexes. Normal coordination and gait     Assessment & Plan:

## 2014-01-25 ENCOUNTER — Telehealth: Payer: Self-pay | Admitting: Family Medicine

## 2014-01-25 ENCOUNTER — Encounter: Payer: Self-pay | Admitting: Family Medicine

## 2014-01-25 ENCOUNTER — Telehealth: Payer: Self-pay | Admitting: *Deleted

## 2014-01-25 DIAGNOSIS — E7800 Pure hypercholesterolemia, unspecified: Secondary | ICD-10-CM

## 2014-01-25 MED ORDER — ROSUVASTATIN CALCIUM 10 MG PO TABS: 10.0000 mg | ORAL_TABLET | Freq: Every day | ORAL | Status: DC

## 2014-01-25 NOTE — Telephone Encounter (Signed)
Called patient. Reviewed labs. We discussed starting statin. He is amenable. Sent in crestor 10 mg daily.

## 2014-01-25 NOTE — Telephone Encounter (Signed)
[facility] mg tab. Formulary and PA form placed in provider box for completion. Clovis PuMartin, Melburn Treiber L, RN

## 2014-01-25 NOTE — Assessment & Plan Note (Addendum)
A: 10 yr CVD risk 7.2 % moderate intensity statin recommended.  P: crestor 10 mg daily

## 2014-01-27 ENCOUNTER — Other Ambulatory Visit: Payer: Self-pay | Admitting: Family Medicine

## 2014-01-27 MED ORDER — SILDENAFIL CITRATE 100 MG PO TABS: 50.0000 mg | ORAL_TABLET | Freq: Every day | ORAL | Status: DC | PRN

## 2014-01-27 MED ORDER — ATORVASTATIN CALCIUM 20 MG PO TABS: 20.0000 mg | ORAL_TABLET | Freq: Every day | ORAL | Status: DC

## 2014-01-27 NOTE — Telephone Encounter (Signed)
Left message for pt to return call regarding Medication.  See message below from Dr. Armen PickupFunches.  Clovis PuMartin, Tamika L, RN

## 2014-01-27 NOTE — Telephone Encounter (Signed)
Sent in lipitor 20 to replace crestor.  lipitor is preferred. Please inform patient.

## 2014-02-28 ENCOUNTER — Encounter: Payer: Self-pay | Admitting: Family Medicine

## 2014-02-28 ENCOUNTER — Ambulatory Visit (INDEPENDENT_AMBULATORY_CARE_PROVIDER_SITE_OTHER): Payer: Medicaid Other | Admitting: Family Medicine

## 2014-02-28 VITALS — BP 126/86 | HR 70 | Temp 98.0°F | Ht 68.75 in | Wt 160.0 lb

## 2014-02-28 DIAGNOSIS — J019 Acute sinusitis, unspecified: Secondary | ICD-10-CM | POA: Insufficient documentation

## 2014-02-28 MED ORDER — HYDROCODONE-HOMATROPINE 5-1.5 MG/5ML PO SYRP: 5.0000 mL | ORAL_SOLUTION | Freq: Four times a day (QID) | ORAL | Status: DC | PRN

## 2014-02-28 MED ORDER — AMOXICILLIN-POT CLAVULANATE 875-125 MG PO TABS: 1.0000 | ORAL_TABLET | Freq: Two times a day (BID) | ORAL | Status: DC

## 2014-02-28 NOTE — Assessment & Plan Note (Signed)
Given persistent symptoms for over 5 days, nasal drainage and sinus pressure and pain, will treat for sinusitis with 7 day course of Augmentin. I suspect that drainage is also contributing to his cough. Chest sounds clear on exam and I will hold on getting a chest x-ray at this time. Treat cough with Hycodan syrup. Reviewed red flags for return such as fever, shortness of breath, worsening symptoms

## 2014-02-28 NOTE — Progress Notes (Signed)
Patient ID: Michael MattocksSufi Fowler    DOB: 06/11/1966, 48 y.o.   MRN: [id number] --- Subjective:  Michael Fowler is a 48 y.o.male who presents to cough hands nasal chest congestion and sinus pressure that started 5 days ago. Cough wakes him up at night. It is productive of green mucus. He has also had nasal drainage purulent green. And congestion. He denies any fevers. He denies any sick contacts. He has been using Nasonex every day and said tyramine. He has used cough drops without help. The headache is located along the frontal sinuses, is pressure-like. No change in vision. He denies any shortness of breath. He is eating and drinking well. No nausea, no vomiting. No diarrhea   ROS: see HPI Past Medical History: reviewed and updated medications and allergies. Social History: Tobacco: Former smoker quit in 2006.  Objective: Filed Vitals:   02/28/14 1530  BP: 126/86  Pulse: 70  Temp: 98 F (36.7 C)    Physical Examination:   General appearance - alert, well appearing, and in no distress Ears - bilateral TM's and external ear canals normal Nose - very erythematous and congested nasal turbinates bilaterally, sinus tenderness along the maxillary and frontal sinuses bilaterally. Mouth - mucous membranes moist, posterior oropharynx is erythematous with cobblestone pattern, no tonsillar exudate. Neck - supple, no significant adenopathy Chest - clear to auscultation, no wheezes, rales or rhonchi, symmetric air entry Heart - normal rate, regular rhythm, normal S1, S2, no murmurs

## 2014-02-28 NOTE — Patient Instructions (Signed)

## 2014-03-31 ENCOUNTER — Other Ambulatory Visit: Payer: Self-pay | Admitting: *Deleted

## 2014-03-31 DIAGNOSIS — N400 Enlarged prostate without lower urinary tract symptoms: Secondary | ICD-10-CM

## 2014-04-01 MED ORDER — TAMSULOSIN HCL 0.4 MG PO CAPS: 0.4000 mg | ORAL_CAPSULE | Freq: Every day | ORAL | Status: DC

## 2014-05-15 ENCOUNTER — Ambulatory Visit (INDEPENDENT_AMBULATORY_CARE_PROVIDER_SITE_OTHER): Payer: Medicaid Other | Admitting: Family Medicine

## 2014-05-15 VITALS — BP 124/78 | HR 73 | Temp 98.2°F | Ht 70.0 in | Wt 158.8 lb

## 2014-05-15 DIAGNOSIS — H409 Unspecified glaucoma: Secondary | ICD-10-CM

## 2014-05-15 DIAGNOSIS — G44209 Tension-type headache, unspecified, not intractable: Secondary | ICD-10-CM

## 2014-05-15 DIAGNOSIS — N400 Enlarged prostate without lower urinary tract symptoms: Secondary | ICD-10-CM

## 2014-05-15 MED ORDER — LATANOPROST 0.005 % OP SOLN: 1.0000 [drp] | Freq: Every day | OPHTHALMIC | Status: DC

## 2014-05-15 MED ORDER — TAMSULOSIN HCL 0.4 MG PO CAPS: 0.4000 mg | ORAL_CAPSULE | Freq: Every day | ORAL | Status: DC

## 2014-05-15 NOTE — Patient Instructions (Addendum)
[name],  Thank you for coming in today. I have refilled your eye drops. Keep f/u with ophthalmology.   I have refilled flomax for the year.   Dr. Armen PickupFunches

## 2014-05-15 NOTE — Progress Notes (Signed)
   Subjective:    Patient ID: Michael Fowler, male    DOB: 12/07/1965, 48 y.o.   MRN: [id number] CC: headaches   HPI 48 yo M presents for SD visit with intermittent HA x 1-2 weeks. Previously attributed to being [place] xalant[place] few [place] Today with mild HA, mild blurred vision. No weakness, nausea, fever. Has restarted xalantan.   Soc Hx: non smoker  Review of Systems As per HPI    Objective:   Physical Exam BP 124/78  Pulse 73  Temp(Src) 98.2 F (36.8 C) (Oral)  Ht 5\' 10"  (1.778 m)  Wt 158 lb 12.8 oz (72.031 kg)  BMI 22.79 kg/m2 General appearance: alert, cooperative and no distress Head: Normocephalic, without obvious abnormality, atraumatic Eyes: conjunctivae/corneas clear. PERRL, EOM's intact.  Ears: normal TM's and external ear canals both ears Nose: Nares normal. Septum midline. Mucosa normal. No drainage or sinus tenderness. Throat: lips, mucosa, and tongue normal; teeth and gums normal     Assessment & Plan:

## 2014-05-16 ENCOUNTER — Encounter: Payer: Self-pay | Admitting: Family Medicine

## 2014-05-16 NOTE — Assessment & Plan Note (Signed)
A: mild HA, no red flag, normal BP P; Reassurance Prn antihistamine Continue xalantan Keep opthalmology f/u to recheck IOP.

## 2014-10-12 ENCOUNTER — Ambulatory Visit (INDEPENDENT_AMBULATORY_CARE_PROVIDER_SITE_OTHER): Payer: Medicaid Other | Admitting: *Deleted

## 2014-10-12 DIAGNOSIS — Z23 Encounter for immunization: Secondary | ICD-10-CM

## 2014-11-06 ENCOUNTER — Ambulatory Visit (INDEPENDENT_AMBULATORY_CARE_PROVIDER_SITE_OTHER): Payer: Medicaid Other | Admitting: Family Medicine

## 2014-11-06 ENCOUNTER — Encounter: Payer: Self-pay | Admitting: Family Medicine

## 2014-11-06 VITALS — BP 127/80 | HR 78 | Temp 98.4°F | Resp 16 | Ht 69.29 in | Wt 163.0 lb

## 2014-11-06 DIAGNOSIS — E785 Hyperlipidemia, unspecified: Secondary | ICD-10-CM

## 2014-11-06 DIAGNOSIS — N400 Enlarged prostate without lower urinary tract symptoms: Secondary | ICD-10-CM

## 2014-11-06 DIAGNOSIS — Z0000 Encounter for general adult medical examination without abnormal findings: Secondary | ICD-10-CM

## 2014-11-06 DIAGNOSIS — L290 Pruritus ani: Secondary | ICD-10-CM

## 2014-11-06 MED ORDER — HYDROCORTISONE 2.5 % RE CREA: 1.0000 "application " | TOPICAL_CREAM | Freq: Two times a day (BID) | RECTAL | Status: DC

## 2014-11-06 NOTE — Patient Instructions (Addendum)
Work on increasing exercise. Do the stool cards and return to us. Come back in at least one week for labwork (cholesterol, kidneys, liver, prostate). Need to wait a week since we did rectal exam today. I am referring you to urology for your prostate enlargement. You will get a phone call to schedule this appointment.  I sent in a cream you can try for the rectal itching.  Follow up with me in 6 months or sooner if you have any problems.  Be well, Dr. Pollie MeyerMcIntyre    Health Maintenance A healthy lifestyle and preventative care can promote health and wellness.  Maintain regular health, dental, and eye exams.  Eat a healthy diet. Foods like vegetables, fruits, whole grains, low-fat dairy products, and lean protein foods contain the nutrients you need and are low in calories. Decrease your intake of foods high in solid fats, added sugars, and salt. Get information about a proper diet from your health care provider, if necessary.  Regular physical exercise is one of the most important things you can do for your health. Most adults should get at least 150 minutes of moderate-intensity exercise (any activity that increases your heart rate and causes you to sweat) each week. In addition, most adults need muscle-strengthening exercises on 2 or more days a week.   Maintain a healthy weight. The body mass index (BMI) is a screening tool to identify possible weight problems. It provides an estimate of body fat based on height and weight. Your health care provider can find your BMI and can help you achieve or maintain a healthy weight. For males 20 years and older:  A BMI below 18.5 is considered underweight.  A BMI of 18.5 to 24.9 is normal.  A BMI of 25 to 29.9 is considered overweight.  A BMI of 30 and above is considered obese.  Maintain normal blood lipids and cholesterol by exercising and minimizing your intake of saturated fat. Eat a balanced diet with plenty of fruits and vegetables. Blood  tests for lipids and cholesterol should begin at age 50 and be repeated every 5 years. If your lipid or cholesterol levels are high, you are over age 50, or you are at high risk for heart disease, you may need your cholesterol levels checked more frequently.Ongoing high lipid and cholesterol levels should be treated with medicines if diet and exercise are not working.  If you smoke, find out from your health care provider how to quit. If you do not use tobacco, do not start.  Lung cancer screening is recommended for adults aged 55-80 years who are at high risk for developing lung cancer because of a history of smoking. A yearly low-dose CT scan of the lungs is recommended for people who have at least a 30-pack-year history of smoking and are current smokers or have quit within the past 15 years. A pack year of smoking is smoking an average of 1 pack of cigarettes a day for 1 year (for example, a 30-pack-year history of smoking could mean smoking 1 pack a day for 30 years or 2 packs a day for 15 years). Yearly screening should continue until the smoker has stopped smoking for at least 15 years. Yearly screening should be stopped for people who develop a health problem that would prevent them from having lung cancer treatment.  If you choose to drink alcohol, do not have more than 2 drinks per day. One drink is considered to be 12 oz (360 mL) of beer, 5 oz (  150 mL) of wine, or 1.5 oz (45 mL) of liquor.  Avoid the use of street drugs. Do not share needles with anyone. Ask for help if you need support or instructions about stopping the use of drugs.  High blood pressure causes heart disease and increases the risk of stroke. Blood pressure should be checked at least every 1-2 years. Ongoing high blood pressure should be treated with medicines if weight loss and exercise are not effective.  If you are 4845-50 years old, ask your health care provider if you should take aspirin to prevent heart  disease.  Diabetes screening involves taking a blood sample to check your fasting blood sugar level. This should be done once every 3 years after age 50 if you are at a normal weight and without risk factors for diabetes. Testing should be considered at a younger age or be carried out more frequently if you are overweight and have at least 1 risk factor for diabetes.  Colorectal cancer can be detected and often prevented. Most routine colorectal cancer screening begins at the age of 50 and continues through age 50. However, your health care provider may recommend screening at an earlier age if you have risk factors for colon cancer. On a yearly basis, your health care provider may provide home test kits to check for hidden blood in the stool. A small camera at the end of a tube may be used to directly examine the colon (sigmoidoscopy or colonoscopy) to detect the earliest forms of colorectal cancer. Talk to your health care provider about this at age 50 when routine screening begins. A direct exam of the colon should be repeated every 5-10 years through age 50, unless early forms of precancerous polyps or small growths are found.  People who are at an increased risk for hepatitis B should be screened for this virus. You are considered at high risk for hepatitis B if:  You were born in a country where hepatitis B occurs often. Talk with your health care provider about which countries are considered high risk.  Your parents were born in a high-risk country and you have not received a shot to protect against hepatitis B (hepatitis B vaccine).  You have HIV or AIDS.  You use needles to inject street drugs.  You live with, or have sex with, someone who has hepatitis B.  You are a man who has sex with other men (MSM).  You get hemodialysis treatment.  You take certain medicines for conditions like cancer, organ transplantation, and autoimmune conditions.  Hepatitis C blood testing is recommended  for all people born from [id number] through 1967 and any individual with known risk factors for hepatitis C.  Healthy men should no longer receive prostate-specific antigen (PSA) blood tests as part of routine cancer screening. Talk to your health care provider about prostate cancer screening.  Testicular cancer screening is not recommended for adolescents or adult males who have no symptoms. Screening includes self-exam, a health care provider exam, and other screening tests. Consult with your health care provider about any symptoms you have or any concerns you have about testicular cancer.  Practice safe sex. Use condoms and avoid high-risk sexual practices to reduce the spread of sexually transmitted infections (STIs).  You should be screened for STIs, including gonorrhea and chlamydia if:  You are sexually active and are younger than 24 years.  You are older than 24 years, and your health care provider tells you that you are at risk for  this type of infection.  Your sexual activity has changed since you were last screened, and you are at an increased risk for chlamydia or gonorrhea. Ask your health care provider if you are at risk.  If you are at risk of being infected with HIV, it is recommended that you take a prescription medicine daily to prevent HIV infection. This is called pre-exposure prophylaxis (PrEP). You are considered at risk if:  You are a man who has sex with other men (MSM).  You are a heterosexual man who is sexually active with multiple partners.  You take drugs by injection.  You are sexually active with a partner who has HIV.  Talk with your health care provider about whether you are at high risk of being infected with HIV. If you choose to begin PrEP, you should first be tested for HIV. You should then be tested every 3 months for as long as you are taking PrEP.  Use sunscreen. Apply sunscreen liberally and repeatedly throughout the day. You should seek shade when your  shadow is shorter than you. Protect yourself by wearing long sleeves, pants, a wide-brimmed hat, and sunglasses year round whenever you are outdoors.  Tell your health care provider of new moles or changes in moles, especially if there is a change in shape or color. Also, tell your health care provider if a mole is larger than the size of a pencil eraser.  A one-time screening for abdominal aortic aneurysm (AAA) and surgical repair of large AAAs by ultrasound is recommended for men aged 65-75 years who are current or former smokers.  Stay current with your vaccines (immunizations). Document Released: 05/15/2008 Document Revised: 11/22/2013 Document Reviewed: 04/14/2011 [facility] Patient Information 2015 [place], [place]. This information is not intended to replace advice given to you by your health care provider. Make sure you discuss any questions you have with your health care provider.

## 2014-11-06 NOTE — Progress Notes (Signed)
Patient ID: Michael Fowler, male   DOB: 10/05/1966, 48 y.o.   MRN: [id number]  HPI:  Patient presents today for a well adult male exam.   Concerns today: see below Exercise: occasionally walks Diet: tries to eat healthy Smoking: denies Alcohol: denies Drugs: denies  Rectal itching - has had some rectal itching. No history of hemorrhoids or pinworms in past. No travel recently. No blood in stool.  Occasional knee pain - prays 5 times a day and goes to mosque, gets some knee stiffness when he kneels down to pray  ROS: See HPI  PMFSH:  Cancers in family: none Medical hx: BPH, HLD, HTN, glaucoma  PHYSICAL EXAM: BP 127/80 mmHg  Pulse 78  Temp(Src) 98.4 F (36.9 C) (Oral)  Resp 16  Ht 5' 9.29" (1.76 m)  Wt 163 lb (73.936 kg)  BMI 23.87 kg/m2  SpO2 100% Gen: NAD, pleasant, cooperative HEENT: NCAT, PERRL, no palpable thyromegaly or anterior cervical lymphadenopathy Heart: RRR, no murmurs Lungs: CTAB, NWOB Abdomen: soft, nontender to palpation Neuro: grossly nonfocal, speech normal Ext: bilat knees without TTP, effusion, or obvious trauma. Full ROM. Negative lachmans and mcmurrays Rectal: normal anus without hemorrhoids or fissures visible. Normal rectal tone. No rectal masses palpable. Prostate is mildly enlarged, R side larger than L. No gross blood.  ASSESSMENT/PLAN:  Health maintenance:  -colonoscopy: discussed with patient today. He declined to have colonoscopy. He is willing to do FOBT stool cards and would consider colonoscopy if these were positive. Cards given today. -prostate cancer screening: pt will return in 1 week for check of PSA given the R>L sided prostate enlargement palpable on exam today. Will also go ahead and refer to urology for further eval. -lipid screening: check lipids at fasting lab appt in 1 week, not on statin despite prior PCP prescribing this -discussed recommendation to increase exercise -handout given on health maintenance topics  Knee pain -  suspect mild arthritis vs pain from kneeling during prayer. Recommend supportive care with ice prn, ibuprofen, tylenol. F/u if not improving or if worsens.  BPH (benign prostatic hypertrophy) Symptomatically stable on flomax. Rectal exam done today due to complaint of rectal itching, and noticed R sided prostatic enlargement greater than L side. Will check PSA in one week (since prostate palpated today) and refer to urology.  Rectal itching No abnormalities on anal exam today. Possibly has non visible or palpable hemorrhoids. Will do trial of anusol. F/u if not improving.    FOLLOW UP: F/u in 6 months for routine medical care, sooner if any problems Referring to urology for R>L prostate enlargement.  Michael J. Pollie MeyerMcIntyre, MD [facility]

## 2014-11-08 ENCOUNTER — Telehealth: Payer: Self-pay | Admitting: General Practice

## 2014-11-08 NOTE — Telephone Encounter (Signed)
Patient's husband called on her behalf stating they need to schedule her 3 month follow up because she will be out of medication soon

## 2014-11-12 DIAGNOSIS — L290 Pruritus ani: Secondary | ICD-10-CM | POA: Insufficient documentation

## 2014-11-12 NOTE — Assessment & Plan Note (Signed)
No abnormalities on anal exam today. Possibly has non visible or palpable hemorrhoids. Will do trial of anusol. F/u if not improving.

## 2014-11-12 NOTE — Assessment & Plan Note (Signed)
Symptomatically stable on flomax. Rectal exam done today due to complaint of rectal itching, and noticed R sided prostatic enlargement greater than L side. Will check PSA in one week (since prostate palpated today) and refer to urology.

## 2014-11-13 NOTE — Telephone Encounter (Signed)
Patient called wrong office, this is a male patient of [facility].

## 2014-11-14 ENCOUNTER — Other Ambulatory Visit: Payer: Medicaid Other

## 2014-11-14 DIAGNOSIS — N400 Enlarged prostate without lower urinary tract symptoms: Secondary | ICD-10-CM

## 2014-11-14 DIAGNOSIS — E785 Hyperlipidemia, unspecified: Secondary | ICD-10-CM

## 2014-11-14 LAB — LIPID PANEL
CHOLESTEROL: 232 mg/dL — AB (ref 0–200)
HDL: 44 mg/dL (ref >39)
LDL Cholesterol: 172 mg/dL — ABNORMAL HIGH (ref 0–99)
Total CHOL/HDL Ratio: 5.3 Ratio
Triglycerides: 81 mg/dL (ref <150)
VLDL: 16 mg/dL (ref 0–40)

## 2014-11-14 LAB — COMPREHENSIVE METABOLIC PANEL
ALBUMIN: 4.5 g/dL (ref 3.5–5.2)
ALT: 15 U/L (ref 0–53)
AST: 19 U/L (ref 0–37)
Alkaline Phosphatase: 64 U/L (ref 39–117)
BUN: 12 mg/dL (ref 6–23)
CALCIUM: 8.9 mg/dL (ref 8.4–10.5)
CHLORIDE: 102 meq/L (ref 96–112)
CO2: 28 meq/L (ref 19–32)
Creat: 0.92 mg/dL (ref 0.50–1.35)
Glucose, Bld: 90 mg/dL (ref 70–99)
POTASSIUM: 4.4 meq/L (ref 3.5–5.3)
SODIUM: 139 meq/L (ref 135–145)
TOTAL PROTEIN: 6.9 g/dL (ref 6.0–8.3)
Total Bilirubin: 0.5 mg/dL (ref 0.2–1.2)

## 2014-11-14 NOTE — Progress Notes (Signed)
CMP,FLP AND PSA DONE TODAY Michael Fowler

## 2014-11-15 LAB — PSA: PSA: 1.7 ng/mL (ref <=4.00)

## 2014-11-17 ENCOUNTER — Encounter: Payer: Self-pay | Admitting: Family Medicine

## 2014-12-19 ENCOUNTER — Encounter: Payer: Self-pay | Admitting: *Deleted

## 2014-12-20 ENCOUNTER — Encounter: Payer: Self-pay | Admitting: Family Medicine

## 2014-12-20 ENCOUNTER — Ambulatory Visit (INDEPENDENT_AMBULATORY_CARE_PROVIDER_SITE_OTHER): Payer: Medicaid Other | Admitting: Family Medicine

## 2014-12-20 VITALS — BP 149/87 | HR 59 | Temp 97.7°F | Ht 70.0 in | Wt 163.3 lb

## 2014-12-20 DIAGNOSIS — S66911A Strain of unspecified muscle, fascia and tendon at wrist and hand level, right hand, initial encounter: Secondary | ICD-10-CM

## 2014-12-20 DIAGNOSIS — IMO0001 Reserved for inherently not codable concepts without codable children: Secondary | ICD-10-CM

## 2014-12-20 DIAGNOSIS — IMO0002 Reserved for concepts with insufficient information to code with codable children: Secondary | ICD-10-CM | POA: Insufficient documentation

## 2014-12-20 NOTE — Progress Notes (Signed)
   Subjective:    Patient ID: Shelby MattocksSufi Mohamed, male    DOB: 09/23/1966, 49 y.o.   MRN: [id number]  Patient presents for a same day appointment.  HPI  RIGHT THUMB PAIN: - Reports symptoms for about 1 week, described as intermittent sharp brief pain located on inside/palmar aspect of thumb with some radiation into forearm, worse with thumb motion or lifting, otherwise improved if relaxed. Denies any recent injury, fall, or trauma. No prior history of Right thumb or hand injury, fractures. States gradual onset with likely trigger of recent increased use of "religious prayer beads and counting device", patient described and demonstrated counting as part of religious practice, which mostly involves quick repetitive Right thumb maneuvers, stated he started this (about 4000 consecutive counts) about 1 week ago prior to onset of pain. - Tried Ibuprofen 400mg  for past 2 days with good relief - Denies any swelling, redness, numbness, tingling, or weakness  I have reviewed and updated the following as appropriate: allergies and current medications  Social Hx: - Former smoker  Review of Systems  See above HPI    Objective:   Physical Exam  BP 149/87 mmHg  Pulse 59  Temp(Src) 97.7 F (36.5 C) (Oral)  Ht 5\' 10"  (1.778 m)  Wt 163 lb 4.8 oz (74.072 kg)  BMI 23.43 kg/m2  Gen - well-appearing, NAD MSK: - Right Hand: Normal appearance, symmetrical to Left, no edema, erythema, or deformity. Full active ROM wrist and fingers, no reproducible tenderness over thenar eminence, carpal bones, or wrist. Negative Tinel's and negative Finkelstein's (bilaterally), normal muscle str 5/5 grip, wrist flex/ext. Ext - non-tender, no edema, peripheral pulses intact +2 b/l radial     Assessment & Plan:

## 2014-12-20 NOTE — Patient Instructions (Signed)
It sounds like your Right thumb is painful due to overuse muscle strain/tendonitis, this is caused by repetitive motions as discussed. It does not seem like Carpal Tunnel. Start taking Ibuprofen 400mg  every 6 hours with food for the next 1-2 weeks, and then may start using it only as needed. Rest your thumb avoid repetitive activities and counting. May use heat / muscle rub as needed  Please schedule a follow-up appointment with Dr. Pollie MeyerMcIntyre in 2-4 weeks if symptoms are worsening or new symptoms.  If you have any other questions or concerns, please feel free to call the clinic to contact me. You may also schedule an earlier appointment if necessary.  Saralyn PilarAlexander Insiya Oshea, DO [facility]

## 2014-12-20 NOTE — Assessment & Plan Note (Signed)
Improved, consistent with strained R-thumb / thenar muscles due to repetitive thumb counting activity. Less likely Carpal Tunnel Syndrome given benign exam (would consider if worsening and new neurological symptoms)  Plan: 1. Reassurance, likely self-limited 2. Advised stop / reduce repetitive thumb maneuvers that triggered injury (at least 2-4 weeks) 3. Ibuprofen 400mg  q 6 hr regularly x up to 1-2 week then PRN 4. Heat, massage, muscle rub PRN 5. RTC 2-4 weeks if significant worsening or new symptoms

## 2015-01-02 ENCOUNTER — Telehealth: Payer: Self-pay | Admitting: Family Medicine

## 2015-01-02 NOTE — Telephone Encounter (Signed)
If this pain is related to his 1/20 office visit with right thumb pain, he would actually be due for follow up and re-examination in clinic where a provider will decide if it warrants referral (which it very well may). He should be seen immediately if new or severe pain, weakness, numbness, or other concerns.   Leona SingletonMaria T Elery Cadenhead, MD

## 2015-01-02 NOTE — Telephone Encounter (Signed)
Patient is requesting a referral to Dr[facility] Dairl PonderMatthew [facility]ingold at [facility].  He is having a lot or pain to his wrist.

## 2015-01-05 ENCOUNTER — Telehealth: Payer: Self-pay | Admitting: Family Medicine

## 2015-01-05 NOTE — Telephone Encounter (Signed)
Pt called and wanted to know if we could call in some muscle relaxer for his wrist. He can not sleep at night because of the pain. jw

## 2015-01-08 NOTE — Telephone Encounter (Signed)
Patient scheduled for 2/12

## 2015-01-08 NOTE — Telephone Encounter (Signed)
Patient needs appointment. Tried calling, mailbox full, unable to leave message.

## 2015-01-08 NOTE — Telephone Encounter (Signed)
Tried calling patient, mailbox full, unable to leave message.

## 2015-01-12 ENCOUNTER — Ambulatory Visit (INDEPENDENT_AMBULATORY_CARE_PROVIDER_SITE_OTHER): Payer: Medicaid Other | Admitting: Family Medicine

## 2015-01-12 ENCOUNTER — Encounter: Payer: Self-pay | Admitting: Family Medicine

## 2015-01-12 VITALS — BP 143/76 | HR 72 | Temp 97.8°F | Ht 70.0 in | Wt 162.8 lb

## 2015-01-12 DIAGNOSIS — M25531 Pain in right wrist: Secondary | ICD-10-CM

## 2015-01-12 NOTE — Progress Notes (Signed)
Patient ID: Michael Fowler, male   DOB: 02/13/1966, 49 y.o.   MRN: [id number]  HPI:  F/u of R wrist pain: continues to have R wrist pain. Has tried antiinflammatory medications without relief. Pain happens every night. Has also tried bengay without help. He thinks there is a tightness in his wrist. It is overall mildly improved compared to previously but is still quite bothersome and he would like a referral to be evaluated by a hand surgeon. His wife previously had carpal tunnel release performed by Dr. Mina MarbleWeingold and he'd like to be referred to the same doctor. Pain worsened by movement of the wrist.  ROS: See HPI.  PMFSH: hx HLD, BPH, glaucoma  PHYSICAL EXAM: BP 143/76 mmHg  Pulse 72  Temp(Src) 97.8 F (36.6 C) (Oral)  Ht 5\' 10"  (1.778 m)  Wt 162 lb 12.8 oz (73.846 kg)  BMI 23.36 kg/m2 Gen: NAD HEENT: NCAT Ext: R wrist with full ROM in flexion, extension, ulnar & radial deviation. No erythema or warmth. Possibly mildly more swollen than L. Mid-wrist extensor tendon appears slightly tighter beneath skin than others. Full strength thumb flexion. 2+ radial pulse. Capillary refill normal. Sensation intact to light touch over distal fingers.  ASSESSMENT/PLAN:  Right wrist pain Continued R wrist pain. Does appear to have tightened tendon in flexor area of R wrist. Pt requesting referral to hand surgery, which I agree with. Refer to Dr. Mina MarbleWeingold per pt's request. Recommend OTC wrist splint in the meantime to limit movement, since movement primary cause of pain.    FOLLOW UP: Referring to hand surgery F/u as needed if symptoms worsen or do not improve.   Michael J. Pollie MeyerMcIntyre, MD [facility]

## 2015-01-12 NOTE — Patient Instructions (Signed)
It was great to see you again today!  I am referring you to hand surgery (Dr. Mina MarbleWeingold) for your wrist pain. You will get a phone call to schedule this appointment.  Buy an over the counter wrist splint to help with your wrist pain and wear this as needed in the meantime.  Be well, Dr. Pollie MeyerMcIntyre

## 2015-01-15 DIAGNOSIS — M25531 Pain in right wrist: Secondary | ICD-10-CM | POA: Insufficient documentation

## 2015-01-15 NOTE — Assessment & Plan Note (Addendum)
Continued R wrist pain. Does appear to have tightened tendon in flexor area of R wrist. Pt requesting referral to hand surgery, which I agree with. Refer to Dr. Mina MarbleWeingold per pt's request. Recommend OTC wrist splint in the meantime to limit movement, since movement primary cause of pain.

## 2015-01-17 ENCOUNTER — Ambulatory Visit: Payer: Medicaid Other | Admitting: Family Medicine

## 2015-02-11 ENCOUNTER — Emergency Department (HOSPITAL_COMMUNITY)
Admission: EM | Admit: 2015-02-11 | Discharge: 2015-02-11 | Disposition: A | Discharge status: Home / Self Care | Payer: Medicaid Other | Source: Home / Self Care | Attending: Emergency Medicine | Admitting: Emergency Medicine

## 2015-02-11 ENCOUNTER — Encounter (HOSPITAL_COMMUNITY): Payer: Self-pay | Admitting: *Deleted

## 2015-02-11 DIAGNOSIS — H6502 Acute serous otitis media, left ear: Secondary | ICD-10-CM | POA: Diagnosis not present

## 2015-02-11 MED ORDER — CETIRIZINE HCL 10 MG PO TABS: 10.0000 mg | ORAL_TABLET | Freq: Every day | ORAL | Status: DC

## 2015-02-11 MED ORDER — IPRATROPIUM BROMIDE 0.06 % NA SOLN: 2.0000 | Freq: Four times a day (QID) | NASAL | Status: DC

## 2015-02-11 MED ORDER — FLUTICASONE PROPIONATE 50 MCG/ACT NA SUSP: 1.0000 | Freq: Every day | NASAL | Status: DC

## 2015-02-11 NOTE — Discharge Instructions (Signed)
You have some clear fluid behind your left eardrum. There is no infection. Take Zyrtec daily for the next 2 weeks. Use Flonase daily for the next 2 weeks. Use Atrovent nasal spray for the next 2 weeks.  If you have not seen any improvement in 1-2 weeks, please follow-up with ENT.

## 2015-02-11 NOTE — ED Notes (Signed)
L  Earache   X  3  Days         r  Ear    Feels        Full            Symptoms         X  3     Days

## 2015-02-11 NOTE — ED Provider Notes (Signed)
CSN: [id number]     Arrival date & time 02/11/15  1012 History   First MD Initiated Contact with Patient 02/11/15 1022     Chief Complaint  Patient presents with  . Otalgia   (Consider location/radiation/quality/duration/timing/severity/associated sxs/prior Treatment) HPI  He is a 49 year old man here for evaluation of left ear pain. He states his left ear has felt full and painful for the last 2-3 days. This is associated with decreased hearing in the left ear. He states he has similar, but much more mild symptoms in the right ear. He also reports temporary change in his voice 2 days ago. He denies any nasal congestion or rhinorrhea. He does report some itchy eyes. No fevers or chills. No ear drainage.  Past Medical History  Diagnosis Date  . Dizziness - light-headed 09/01/2011  . BACK STRAIN, LUMBAR 02/16/2009    Qualifier: Diagnosis of  By: Clelia Croft MD, Kimberlee    . INGROWN TOENAIL 04/09/2007    Qualifier: Diagnosis of  By: Erenest Rasher MD, MADELEINE    . Clavicular fracture 2003    Sustained during [facility]  . H pylori ulcer 07/11/2013  . GERD (gastroesophageal reflux disease) 04/29/2011   History reviewed. No pertinent past surgical history. History reviewed. No pertinent family history. History  Substance Use Topics  . Smoking status: Former Smoker    Types: Cigarettes    Quit date: 01/16/2005  . Smokeless tobacco: Never Used  . Alcohol Use: No    Review of Systems  Constitutional: Negative for fever.  HENT: Positive for ear pain and voice change. Negative for congestion, ear discharge, rhinorrhea, sinus pressure and sore throat.   Eyes: Positive for itching.  Respiratory: Negative for cough and shortness of breath.     Allergies  Review of patient's allergies indicates no known allergies.  Home Medications   Prior to Admission medications   Medication Sig Start Date End Date Taking? Authorizing Provider  atorvastatin (LIPITOR) 20 MG tablet Take 1 tablet (20 mg total) by mouth  daily. 01/27/14   Dessa Phi, MD  cetirizine (ZYRTEC) 10 MG tablet Take 1 tablet (10 mg total) by mouth daily. 02/11/15   Charm Rings, MD  fluticasone (FLONASE) 50 MCG/ACT nasal spray Place 1 spray into both nostrils daily. 02/11/15   Charm Rings, MD  hydrocortisone (ANUSOL-HC) 2.5 % rectal cream Place 1 application rectally 2 (two) times daily. 11/06/14   Latrelle Dodrill, MD  ipratropium (ATROVENT) 0.06 % nasal spray Place 2 sprays into both nostrils 4 (four) times daily. 02/11/15   Charm Rings, MD  latanoprost (XALATAN) 0.005 % ophthalmic solution Place 1 drop into both eyes at bedtime. 05/15/14   Dessa Phi, MD  sildenafil (VIAGRA) 100 MG tablet Take 0.5-1 tablets (50-100 mg total) by mouth daily as needed for erectile dysfunction. 01/27/14   Dessa Phi, MD  tamsulosin (FLOMAX) 0.4 MG CAPS capsule Take 1 capsule (0.4 mg total) by mouth daily. 05/15/14   Josalyn Funches, MD   BP 140/86 mmHg  Pulse 78  Temp(Src) 98.6 F (37 C) (Oral)  Resp 18  SpO2 100% Physical Exam  Constitutional: He is oriented to person, place, and time. He appears well-developed and well-nourished. No distress.  HENT:  Right Ear: Tympanic membrane and external ear normal.  Left Ear: External ear normal. Tympanic membrane is not erythematous. A middle ear effusion (clear fluid) is present. Decreased hearing is noted.  Eyes: Conjunctivae are normal.  Neck: Neck supple.  Cardiovascular: Normal rate.   Pulmonary/Chest: Effort  normal.  Neurological: He is alert and oriented to person, place, and time.    ED Course  Procedures (including critical care time) Labs Review Labs Reviewed - No data to display  Imaging Review No results found.   MDM   1. Acute serous otitis media of left ear, recurrence not specified    Likely secondary to developing allergies. Will treat with Zyrtec, Atrovent, Flonase. If no improvement in 1-2 weeks, he will follow-up with ENT.    Charm RingsErin J Challen Spainhour, MD 02/11/15  1044

## 2015-03-01 ENCOUNTER — Encounter: Payer: Self-pay | Admitting: Family Medicine

## 2015-03-01 ENCOUNTER — Ambulatory Visit (INDEPENDENT_AMBULATORY_CARE_PROVIDER_SITE_OTHER): Payer: Medicaid Other | Admitting: Family Medicine

## 2015-03-01 VITALS — BP 140/84 | HR 78 | Temp 97.7°F | Ht 67.0 in | Wt 159.0 lb

## 2015-03-01 DIAGNOSIS — R519 Headache, unspecified: Secondary | ICD-10-CM

## 2015-03-01 DIAGNOSIS — R51 Headache: Secondary | ICD-10-CM

## 2015-03-01 MED ORDER — MECLIZINE HCL 25 MG PO TABS: 25.0000 mg | ORAL_TABLET | Freq: Three times a day (TID) | ORAL | Status: DC | PRN

## 2015-03-01 NOTE — Progress Notes (Signed)
Patient ID: Michael MattocksSufi Fowler, male   DOB: 11/02/1966, 49 y.o.   MRN: [id number]  HPI:  Headaches: has had dizziness x 1 week. 2-3 weeks ago went to Urgent care with left ear pain. Diagnosed with likely serous otitis media. Given prescription for Zyrtec and Flonase at that time. He's been compliant with these medications but has not noticed any improvement. Think is hearing less in his left ear. Also thinks left ear might of been ringing sound. The dizziness is worse whenever he prays and bends down. Also has headache when he wakes up in the morning. Denies numbness or weakness anywhere in his body. This problem was not getting worse, but is also not getting better. He denies any confusion or speech problems. He is followed by his eye doctor Dr. Harlon FlorWhitaker as well as Dr. Clarisa KindredGeiger who performs injections in his eye for glaucoma. Reports he is drinking plenty. Aleve has not helped.  ROS: See HPI.  PMFSH: Hypertension, hyperlipidemia, prior left central vein occlusion  PHYSICAL EXAM: BP 140/84 mmHg  Pulse 78  Temp(Src) 97.7 F (36.5 C) (Oral)  Ht 5\' 7"  (1.702 m)  Wt 159 lb (72.122 kg)  BMI 24.90 kg/m2 Gen: No acute distress, pleasant, cooperative HEENT: Normocephalic, atraumatic, full range of motion of neck in all directions. TMs clear bilaterally. Nares patent. Heart: Regular rate and rhythm, no murmurs Lungs: Clear to auscultation bilaterally, normal respiratory effort Neuro: Cranial nerves II through XII tested and intact. Normal finger-nose-finger bilaterally. Sensation intact throughout all extremities. Full strength in bilateral upper and lower extremities. Romberg negative. Funduscopic exam attempted by Dr. Leveda AnnaHensel, however fundus not well visualized in either eye. Ext: No edema  ASSESSMENT/PLAN:  Headache Headache and dizziness of several weeks duration. No clear etiology by history or exam. Neurological exam is completely normal today. Headache that has not improved in 3 weeks in a 49 year old  has potential to represent a possible intracranial lesion. It is reassuring that this problem was not getting worse with time. Discussed possibility of referral to ENT or proceeding with an MRI brain. Patient prefers to proceed with meclizine treatment. He will follow-up in a few weeks to see if this is getting better. He will call me if he gets worse in the meantime. Dr. Leveda AnnaHensel is in agreement with this plan.    FOLLOW UP: F/u in 2-3 weeks for headache/dizziness  GrenadaBrittany J. Pollie MeyerMcIntyre, MD [facility]

## 2015-03-01 NOTE — Patient Instructions (Signed)
Take meclizine 25mg  three times a day as needed for dizziness Tylenol and advil as needed for headache  Follow upw ith me in 2-3 weeks Sooner if you feel worse We'll see at that time if we need to get MRI or send you to ear nose and throat doctor  Be well, Dr. Pollie MeyerMcIntyre

## 2015-03-02 DIAGNOSIS — R519 Headache, unspecified: Secondary | ICD-10-CM | POA: Insufficient documentation

## 2015-03-02 DIAGNOSIS — R51 Headache: Secondary | ICD-10-CM

## 2015-03-02 NOTE — Assessment & Plan Note (Signed)
Headache and dizziness of several weeks duration. No clear etiology by history or exam. Neurological exam is completely normal today. Headache that has not improved in 3 weeks in a 49 year old has potential to represent a possible intracranial lesion. It is reassuring that this problem was not getting worse with time. Discussed possibility of referral to ENT or proceeding with an MRI brain. Patient prefers to proceed with meclizine treatment. He will follow-up in a few weeks to see if this is getting better. He will call me if he gets worse in the meantime. Dr. Leveda AnnaHensel is in agreement with this plan.

## 2015-03-05 ENCOUNTER — Telehealth: Payer: Self-pay | Admitting: Family Medicine

## 2015-03-05 DIAGNOSIS — R42 Dizziness and giddiness: Secondary | ICD-10-CM

## 2015-03-05 NOTE — Telephone Encounter (Signed)
Referral entered. Please inform patient. Michael Fonseca J Franciso Dierks, MD  

## 2015-03-05 NOTE — Telephone Encounter (Signed)
[facility], Tia will contact patient with appointment.

## 2015-03-05 NOTE — Telephone Encounter (Signed)
Pt is requesting a referral to an ear doctor, says is still having trouble with it.

## 2015-08-15 ENCOUNTER — Other Ambulatory Visit: Payer: Self-pay | Admitting: Family Medicine

## 2015-08-30 ENCOUNTER — Encounter: Payer: Medicaid Other | Admitting: Family Medicine

## 2015-10-16 ENCOUNTER — Encounter: Payer: Medicaid Other | Admitting: Family Medicine

## 2015-10-23 ENCOUNTER — Ambulatory Visit (INDEPENDENT_AMBULATORY_CARE_PROVIDER_SITE_OTHER): Payer: Medicaid Other | Admitting: Family Medicine

## 2015-10-23 ENCOUNTER — Encounter: Payer: Self-pay | Admitting: Family Medicine

## 2015-10-23 VITALS — BP 138/79 | HR 65 | Temp 97.7°F | Ht 67.0 in | Wt 153.0 lb

## 2015-10-23 DIAGNOSIS — Z23 Encounter for immunization: Secondary | ICD-10-CM | POA: Diagnosis present

## 2015-10-23 DIAGNOSIS — E7800 Pure hypercholesterolemia, unspecified: Secondary | ICD-10-CM

## 2015-10-23 DIAGNOSIS — I10 Essential (primary) hypertension: Secondary | ICD-10-CM

## 2015-10-23 DIAGNOSIS — N400 Enlarged prostate without lower urinary tract symptoms: Secondary | ICD-10-CM

## 2015-10-23 DIAGNOSIS — H409 Unspecified glaucoma: Secondary | ICD-10-CM | POA: Diagnosis not present

## 2015-10-23 NOTE — Assessment & Plan Note (Signed)
Follows up with an ophthalmologist regularly.  Reports that his pressures are well-controlled

## 2015-10-23 NOTE — Progress Notes (Signed)
  Patient name: Marcell AngerSufi Hassan Sheik-Mohamed MRN [id number]  Date of birth: 11/16/1966  CC & HPI:  Bing PlumeSufi Despina HickHassan Sheik-Mohamed is a 49 y.o. male presenting today for HTN, HLD and physical.   HTN - Blood pressure borderline - Not currently on hypertension medications, but taking Flomax for BPH - Denies chest pain, shortness of breath, vision problems - Nonsmoker - Does not exercise  HLD - Reports never starting Lipitor  Declines flu shot and colonoscopy  ROS: See HPI   Medical & Surgical Hx:  Reviewed  Medications & Allergies: Reviewed  Social History: Reviewed:   Objective Findings:  Vitals: BP 138/79 mmHg  Pulse 65  Temp(Src) 97.7 F (36.5 C) (Oral)  Ht 5\' 7"  (1.702 m)  Wt 153 lb (69.4 kg)  BMI 23.96 kg/m2  Gen: NAD CV: RRR w/o m/r/g, pulses +2 b/l Resp: CTAB w/ normal respiratory effort  Assessment & Plan:   Hypertension BP Borderline - Discussed diet and exercise and he will work on these - Encouraged 30-60 minutes of exercise daily, as well as will also help with his low energy  Glaucoma Follows up with an ophthalmologist regularly.  Reports that his pressures are well-controlled  BPH (benign prostatic hypertrophy) Continues to take Flomax with symptomatic improvement  HYPERCHOLESTEROLEMIA Reports never starting Lipitor - Will recheck fasting lipid panel to reassess CVD risk

## 2015-10-23 NOTE — Assessment & Plan Note (Signed)
Continues to take Flomax with symptomatic improvement

## 2015-10-23 NOTE — Patient Instructions (Signed)
It was great seeing you today.   1. I have ordered labs for you to come back into tomorrow when you are fasting.  Make an appointment with the lab on your way out.  I will send you a letter with these results.     Please bring all your medications to every doctors visit  Sign up for My Chart to have easy access to your labs results, and communication with your Primary care physician.  Next Appointment  Please call to make an appointment with Dr Gayla DossJoyner in 1 year, or sooner if you have an new problems to discuss   I look forward to talking with you again at our next visit. If you have any questions or concerns before then, please call the clinic at [phone].  Take Care,   Dr Wenda LowJames Noris Kulinski

## 2015-10-23 NOTE — Assessment & Plan Note (Signed)
BP Borderline - Discussed diet and exercise and he will work on these - Encouraged 30-60 minutes of exercise daily, as well as will also help with his low energy

## 2015-10-23 NOTE — Assessment & Plan Note (Signed)
Reports never starting Lipitor - Will recheck fasting lipid panel to reassess CVD risk

## 2015-10-24 ENCOUNTER — Other Ambulatory Visit: Payer: Medicaid Other

## 2015-11-19 ENCOUNTER — Other Ambulatory Visit: Payer: Self-pay | Admitting: Family Medicine

## 2015-12-26 ENCOUNTER — Other Ambulatory Visit: Payer: Medicaid Other

## 2015-12-26 DIAGNOSIS — I10 Essential (primary) hypertension: Secondary | ICD-10-CM

## 2015-12-26 DIAGNOSIS — E7800 Pure hypercholesterolemia, unspecified: Secondary | ICD-10-CM

## 2015-12-26 LAB — LIPID PANEL
CHOLESTEROL: 245 mg/dL — AB (ref 125–200)
HDL: 45 mg/dL (ref >=40)
LDL CALC: 184 mg/dL — AB (ref <130)
TRIGLYCERIDES: 79 mg/dL (ref <150)
Total CHOL/HDL Ratio: 5.4 Ratio — ABNORMAL HIGH (ref <=5.0)
VLDL: 16 mg/dL (ref <30)

## 2015-12-26 LAB — CBC
HCT: 46 % (ref 39.0–52.0)
Hemoglobin: 16 g/dL (ref 13.0–17.0)
MCH: 32.2 pg (ref 26.0–34.0)
MCHC: 34.8 g/dL (ref 30.0–36.0)
MCV: 92.6 fL (ref 78.0–100.0)
MPV: 9.9 fL (ref 8.6–12.4)
Platelets: 203 10*3/uL (ref 150–400)
RBC: 4.97 MIL/uL (ref 4.22–5.81)
RDW: 12.9 % (ref 11.5–15.5)
WBC: 5 10*3/uL (ref 4.0–10.5)

## 2015-12-26 LAB — COMPREHENSIVE METABOLIC PANEL
ALBUMIN: 4 g/dL (ref 3.6–5.1)
ALT: 16 U/L (ref 9–46)
AST: 19 U/L (ref 10–35)
Alkaline Phosphatase: 65 U/L (ref 40–115)
BUN: 10 mg/dL (ref 7–25)
CO2: 29 mmol/L (ref 20–31)
Calcium: 8.7 mg/dL (ref 8.6–10.3)
Chloride: 102 mmol/L (ref 98–110)
Creat: 0.87 mg/dL (ref 0.70–1.33)
Glucose, Bld: 84 mg/dL (ref 65–99)
POTASSIUM: 4.1 mmol/L (ref 3.5–5.3)
Sodium: 137 mmol/L (ref 135–146)
TOTAL PROTEIN: 6.9 g/dL (ref 6.1–8.1)
Total Bilirubin: 0.6 mg/dL (ref 0.2–1.2)

## 2015-12-26 NOTE — Progress Notes (Signed)
Labs done today Michael Fowler 

## 2016-01-09 ENCOUNTER — Telehealth: Payer: Self-pay | Admitting: Family Medicine

## 2016-01-09 NOTE — Telephone Encounter (Signed)
Pt called and would like to know what his lab results are. Myriam Jacobson

## 2016-01-10 NOTE — Telephone Encounter (Signed)
Will forward to MD to verify results. Zaedyn Covin,CMA

## 2016-01-11 ENCOUNTER — Encounter: Payer: Self-pay | Admitting: Family Medicine

## 2016-04-24 ENCOUNTER — Telehealth: Payer: Self-pay | Admitting: Family Medicine

## 2016-04-24 ENCOUNTER — Other Ambulatory Visit: Payer: Self-pay | Admitting: Family Medicine

## 2016-04-24 DIAGNOSIS — H348192 Central retinal vein occlusion, unspecified eye, stable: Secondary | ICD-10-CM

## 2016-04-24 NOTE — Telephone Encounter (Signed)
Need referral to [facility] for patient's office visit now.  He's seeing Dr. Allena KatzPatel.  NPI  [id number].   Dx.  [facility].  Fax # [phone].

## 2016-04-25 NOTE — Telephone Encounter (Signed)
Faxed

## 2016-05-24 ENCOUNTER — Other Ambulatory Visit: Payer: Self-pay | Admitting: Family Medicine

## 2016-12-15 ENCOUNTER — Encounter: Payer: Self-pay | Admitting: Internal Medicine

## 2016-12-15 ENCOUNTER — Ambulatory Visit (INDEPENDENT_AMBULATORY_CARE_PROVIDER_SITE_OTHER): Payer: Medicaid Other | Admitting: Internal Medicine

## 2016-12-15 ENCOUNTER — Ambulatory Visit (HOSPITAL_COMMUNITY)
Admission: RE | Admit: 2016-12-15 | Discharge: 2016-12-15 | Disposition: A | Discharge status: Home / Self Care | Payer: Medicaid Other | Source: Ambulatory Visit | Attending: Family Medicine | Admitting: Family Medicine

## 2016-12-15 VITALS — BP 104/70 | HR 74 | Temp 97.9°F | Ht 70.0 in | Wt 152.6 lb

## 2016-12-15 DIAGNOSIS — R058 Other specified cough: Secondary | ICD-10-CM

## 2016-12-15 DIAGNOSIS — Z23 Encounter for immunization: Secondary | ICD-10-CM | POA: Diagnosis present

## 2016-12-15 DIAGNOSIS — R05 Cough: Secondary | ICD-10-CM | POA: Insufficient documentation

## 2016-12-15 NOTE — Assessment & Plan Note (Signed)
Has been going on for 10 days. [place] to [place] and [place]. Took malaria prophylaxis throughout trip. He has had subjective fevers and chills. No chest pain or shortness of breath. On exam, mild crackles are auscultated in the right lung base. Concern for pneumonia. Think this could also be a viral syndrome.  - 2-view CXR to rule out pneumonia - Advised Pt to use Afrin x 3 days for the congestion - I will call patient to discuss the x-ray results.

## 2016-12-15 NOTE — Patient Instructions (Addendum)
It was so nice to meet you!  You should buy some Afrin nasal spray over the counter to help with your congestion. Please only use this medication for 3 days.  I am sending you for a chest x-ray. I will call you with these results.  -Dr. Nancy MarusMayo

## 2016-12-15 NOTE — Progress Notes (Signed)
   [facility] Phone: [phone]  Subjective:  Michael Fowler is a 51 year old male presenting to clinic with productive cough, congestion, and occasional subjective fevers for the last 10 days. He traveled to [place] and [place] from 12/6-1/10. His symptoms started on 1/5. His cough is productive of thick yellow sputum. He is also have thick yellow nasal drainage. He has tried using OTC cough syrup, cold & flu med, throat lozenges, and Mucinex. He denies any chest pain, shortness of breath, nausea, or vomiting. He has had subjective fevers and chills but hasn't taken his temperature.   Both him and his wife were seen by his wife's physician prior to leaving for their trip. They were given prophylactic malaria medications, which they took daily.  ROS: See HPI for pertinent positives and negatives  Past Medical History- HTN, BPH, HLD, allergic rhinitis  Family history reviewed for today's visit. No changes.  Social history- patient is a former smoker  Objective: BP 104/70 (BP Location: Right Arm, Patient Position: Sitting, Cuff Size: Normal)   Pulse 74   Temp 97.9 F (36.6 C) (Oral)   Ht 5\' 10"  (1.778 m)   Wt 152 lb 9.6 oz (69.2 kg)   SpO2 99%   BMI 21.90 kg/m  Gen: NAD, alert, cooperative with exam HEENT: NCAT, EOMI, no scleral icterus, TMs clear, nasal turbinates edematous and erythematous, oropharynx erythematous without exudate, MMM Neck: FROM, supple, no cervical lymphadenopathy CV: RRR, no murmur Resp: Normal work of breathing. Mild crackles auscultated in the right lung base. Good air movement throughout all lung fields. Msk: No lower extremity edema  Assessment/Plan: Productive Cough: Has been going on for 10 days. Recently traveled to [place] and [place]. Took malaria prophylaxis throughout trip. He has had subjective fevers and chills. No chest pain or shortness of breath. On exam, mild crackles are auscultated in the right lung base. Concern for pneumonia.  Think this could also be a viral syndrome.  - 2-view CXR to rule out pneumonia - Advised Pt to use Afrin x 3 days for the congestion - I will call patient to discuss the x-ray results.   Willadean CarolKaty Mayo, MD PGY-2

## 2016-12-20 ENCOUNTER — Other Ambulatory Visit: Payer: Self-pay | Admitting: Family Medicine

## 2016-12-24 ENCOUNTER — Ambulatory Visit: Payer: Medicaid Other

## 2017-02-08 ENCOUNTER — Emergency Department (HOSPITAL_COMMUNITY): Payer: No Typology Code available for payment source

## 2017-02-08 ENCOUNTER — Emergency Department (HOSPITAL_COMMUNITY)
Admission: EM | Admit: 2017-02-08 | Discharge: 2017-02-08 | Disposition: A | Discharge status: Home / Self Care | Payer: No Typology Code available for payment source | Attending: Emergency Medicine | Admitting: Emergency Medicine

## 2017-02-08 ENCOUNTER — Encounter (HOSPITAL_COMMUNITY): Payer: Self-pay | Admitting: Emergency Medicine

## 2017-02-08 ENCOUNTER — Other Ambulatory Visit: Payer: Self-pay

## 2017-02-08 DIAGNOSIS — Z79899 Other long term (current) drug therapy: Secondary | ICD-10-CM | POA: Diagnosis not present

## 2017-02-08 DIAGNOSIS — Y939 Activity, unspecified: Secondary | ICD-10-CM | POA: Insufficient documentation

## 2017-02-08 DIAGNOSIS — M542 Cervicalgia: Secondary | ICD-10-CM | POA: Insufficient documentation

## 2017-02-08 DIAGNOSIS — Y92410 Unspecified street and highway as the place of occurrence of the external cause: Secondary | ICD-10-CM | POA: Diagnosis not present

## 2017-02-08 DIAGNOSIS — Z87891 Personal history of nicotine dependence: Secondary | ICD-10-CM | POA: Diagnosis not present

## 2017-02-08 DIAGNOSIS — I10 Essential (primary) hypertension: Secondary | ICD-10-CM | POA: Diagnosis not present

## 2017-02-08 DIAGNOSIS — R109 Unspecified abdominal pain: Secondary | ICD-10-CM | POA: Diagnosis not present

## 2017-02-08 DIAGNOSIS — Y999 Unspecified external cause status: Secondary | ICD-10-CM | POA: Insufficient documentation

## 2017-02-08 DIAGNOSIS — R0789 Other chest pain: Secondary | ICD-10-CM | POA: Insufficient documentation

## 2017-02-08 LAB — I-STAT CHEM 8, ED
BUN: 12 mg/dL (ref 6–20)
CREATININE: 0.8 mg/dL (ref 0.61–1.24)
Calcium, Ion: 1.21 mmol/L (ref 1.15–1.40)
Chloride: 98 mmol/L — ABNORMAL LOW (ref 101–111)
Glucose, Bld: 88 mg/dL (ref 65–99)
HEMATOCRIT: 45 % (ref 39.0–52.0)
HEMOGLOBIN: 15.3 g/dL (ref 13.0–17.0)
Potassium: 3.6 mmol/L (ref 3.5–5.1)
SODIUM: 140 mmol/L (ref 135–145)
TCO2: 30 mmol/L (ref 0–100)

## 2017-02-08 MED ORDER — METHOCARBAMOL 500 MG PO TABS
500.0000 mg | ORAL_TABLET | Freq: Two times a day (BID) | ORAL | 0 refills | Status: DC
Start: 2017-02-08 — End: 2017-05-15

## 2017-02-08 MED ORDER — IOPAMIDOL (ISOVUE-300) INJECTION 61%
INTRAVENOUS | Status: AC
  Administered 2017-02-08: 100 mL
  Filled 2017-02-08: qty 100

## 2017-02-08 MED ORDER — LIDOCAINE 5 % EX PTCH: 1.0000 | MEDICATED_PATCH | CUTANEOUS | 0 refills | Status: DC

## 2017-02-08 MED ORDER — ONDANSETRON HCL 4 MG/2ML IJ SOLN
4.0000 mg | Freq: Once | INTRAMUSCULAR | Status: AC
  Administered 2017-02-08: 4 mg via INTRAVENOUS
  Filled 2017-02-08: qty 2

## 2017-02-08 MED ORDER — MORPHINE SULFATE (PF) 4 MG/ML IV SOLN
4.0000 mg | Freq: Once | INTRAVENOUS | Status: AC
  Administered 2017-02-08: 4 mg via INTRAVENOUS
  Filled 2017-02-08: qty 1

## 2017-02-08 MED ORDER — DICLOFENAC SODIUM 1 % TD GEL: 4.0000 g | Freq: Four times a day (QID) | TRANSDERMAL | 0 refills | Status: DC

## 2017-02-08 NOTE — Discharge Instructions (Signed)
Expect your soreness to increase over the next 2-3 days. Take it easy, but do not lay around too much as this may make any stiffness worse.  Antiinflammatory medications: If you are able to take them, you may take ibuprofen or naproxen to reduce pain and inflammation. Take these medications with food to avoid upset stomach. Choose only one of these medications, do not take them together.  If you are unable to take these medications, you may try using the diclofenac gel on painful muscles.   Muscle relaxer: Robaxin is a muscle relaxer and may help loosen stiff muscles. Do not take the Robaxin while driving or performing other dangerous activities.   Lidocaine patches: These are available via either prescription or over-the-counter. The over-the-counter option may be more economical one and are likely just as effective. There are multiple over-the-counter brands, such as Salonpas.  Exercises: Be sure to perform the attached exercises starting with three times a week and working up to performing them daily. This is an essential part of preventing long term problems.   Follow up with a primary care provider for any future management of these complaints.
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
## 2017-02-26 ENCOUNTER — Ambulatory Visit: Payer: Medicaid Other | Admitting: Internal Medicine
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
## 2017-03-05 ENCOUNTER — Ambulatory Visit (INDEPENDENT_AMBULATORY_CARE_PROVIDER_SITE_OTHER): Payer: Self-pay | Admitting: Family Medicine

## 2017-03-05 ENCOUNTER — Encounter: Payer: Self-pay | Admitting: Family Medicine

## 2017-03-05 DIAGNOSIS — R0602 Shortness of breath: Secondary | ICD-10-CM

## 2017-03-05 DIAGNOSIS — M542 Cervicalgia: Secondary | ICD-10-CM | POA: Insufficient documentation

## 2017-03-05 DIAGNOSIS — R079 Chest pain, unspecified: Secondary | ICD-10-CM | POA: Insufficient documentation

## 2017-03-05 DIAGNOSIS — R0782 Intercostal pain: Secondary | ICD-10-CM

## 2017-03-05 NOTE — Assessment & Plan Note (Signed)
Likely related to intercostal muscle strain Lung exam benign today No evidence of DVT
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
## 2017-03-05 NOTE — Assessment & Plan Note (Signed)
Likely related to intercostal strain Diffuse tenderness to palpation over anterior chest wall No evidence that this is a cardiac etiology

## 2017-03-20 ENCOUNTER — Other Ambulatory Visit: Payer: Self-pay | Admitting: Family Medicine

## 2017-05-14 ENCOUNTER — Emergency Department (HOSPITAL_COMMUNITY)
Admission: EM | Admit: 2017-05-14 | Discharge: 2017-05-15 | Disposition: A | Discharge status: Home / Self Care | Payer: No Typology Code available for payment source | Attending: Emergency Medicine | Admitting: Emergency Medicine

## 2017-05-14 DIAGNOSIS — Y939 Activity, unspecified: Secondary | ICD-10-CM | POA: Diagnosis not present

## 2017-05-14 DIAGNOSIS — S161XXA Strain of muscle, fascia and tendon at neck level, initial encounter: Secondary | ICD-10-CM | POA: Insufficient documentation

## 2017-05-14 DIAGNOSIS — R0789 Other chest pain: Secondary | ICD-10-CM | POA: Diagnosis not present

## 2017-05-14 DIAGNOSIS — S199XXA Unspecified injury of neck, initial encounter: Secondary | ICD-10-CM | POA: Diagnosis present

## 2017-05-14 DIAGNOSIS — Y92410 Unspecified street and highway as the place of occurrence of the external cause: Secondary | ICD-10-CM | POA: Insufficient documentation

## 2017-05-14 DIAGNOSIS — Z87891 Personal history of nicotine dependence: Secondary | ICD-10-CM | POA: Diagnosis not present

## 2017-05-14 DIAGNOSIS — M549 Dorsalgia, unspecified: Secondary | ICD-10-CM | POA: Diagnosis not present

## 2017-05-14 DIAGNOSIS — Y999 Unspecified external cause status: Secondary | ICD-10-CM | POA: Insufficient documentation

## 2017-05-14 DIAGNOSIS — M7918 Myalgia, other site: Secondary | ICD-10-CM

## 2017-05-14 DIAGNOSIS — R1084 Generalized abdominal pain: Secondary | ICD-10-CM | POA: Diagnosis not present

## 2017-05-14 DIAGNOSIS — M791 Myalgia: Secondary | ICD-10-CM | POA: Diagnosis not present

## 2017-05-15 ENCOUNTER — Emergency Department (HOSPITAL_COMMUNITY): Payer: No Typology Code available for payment source

## 2017-05-15 LAB — CBC WITH DIFFERENTIAL/PLATELET
Basophils Absolute: 0 10*3/uL (ref 0.0–0.1)
Basophils Relative: 0 %
EOS PCT: 1 %
Eosinophils Absolute: 0 10*3/uL (ref 0.0–0.7)
HCT: 38.9 % — ABNORMAL LOW (ref 39.0–52.0)
Hemoglobin: 13.3 g/dL (ref 13.0–17.0)
LYMPHS ABS: 2.1 10*3/uL (ref 0.7–4.0)
LYMPHS PCT: 32 %
MCH: 31.1 pg (ref 26.0–34.0)
MCHC: 34.2 g/dL (ref 30.0–36.0)
MCV: 91.1 fL (ref 78.0–100.0)
Monocytes Absolute: 0.4 10*3/uL (ref 0.1–1.0)
Monocytes Relative: 6 %
Neutro Abs: 3.8 10*3/uL (ref 1.7–7.7)
Neutrophils Relative %: 61 %
PLATELETS: 173 10*3/uL (ref 150–400)
RBC: 4.27 MIL/uL (ref 4.22–5.81)
RDW: 11.8 % (ref 11.5–15.5)
WBC: 6.3 10*3/uL (ref 4.0–10.5)

## 2017-05-15 LAB — COMPREHENSIVE METABOLIC PANEL
ALK PHOS: 56 U/L (ref 38–126)
ALT: 20 U/L (ref 17–63)
AST: 24 U/L (ref 15–41)
Albumin: 3.6 g/dL (ref 3.5–5.0)
Anion gap: 8 (ref 5–15)
BUN: 11 mg/dL (ref 6–20)
CALCIUM: 8.1 mg/dL — AB (ref 8.9–10.3)
CHLORIDE: 104 mmol/L (ref 101–111)
CO2: 25 mmol/L (ref 22–32)
CREATININE: 0.86 mg/dL (ref 0.61–1.24)
Glucose, Bld: 114 mg/dL — ABNORMAL HIGH (ref 65–99)
Potassium: 3.4 mmol/L — ABNORMAL LOW (ref 3.5–5.1)
Sodium: 137 mmol/L (ref 135–145)
TOTAL PROTEIN: 6.1 g/dL — AB (ref 6.5–8.1)
Total Bilirubin: 0.7 mg/dL (ref 0.3–1.2)

## 2017-05-15 LAB — LIPASE, BLOOD: LIPASE: 28 U/L (ref 11–51)

## 2017-05-15 MED ORDER — CYCLOBENZAPRINE HCL 10 MG PO TABS: 10.0000 mg | ORAL_TABLET | Freq: Two times a day (BID) | ORAL | 0 refills | Status: DC | PRN

## 2017-05-15 MED ORDER — ACETAMINOPHEN 500 MG PO TABS
1000.0000 mg | ORAL_TABLET | Freq: Once | ORAL | Status: AC
  Administered 2017-05-15: 1000 mg via ORAL
  Filled 2017-05-15: qty 2

## 2017-05-15 MED ORDER — OXYCODONE HCL 5 MG PO TABS
5.0000 mg | ORAL_TABLET | Freq: Once | ORAL | Status: AC
  Administered 2017-05-15: 5 mg via ORAL
  Filled 2017-05-15: qty 1

## 2017-05-15 MED ORDER — CYCLOBENZAPRINE HCL 10 MG PO TABS
10.0000 mg | ORAL_TABLET | Freq: Once | ORAL | Status: AC
  Administered 2017-05-15: 10 mg via ORAL
  Filled 2017-05-15: qty 1

## 2017-05-15 MED ORDER — IOPAMIDOL (ISOVUE-300) INJECTION 61%
INTRAVENOUS | Status: AC
  Administered 2017-05-15: 100 mL
  Filled 2017-05-15: qty 100

## 2017-05-15 NOTE — ED Notes (Signed)
Family at bedside. 

## 2017-05-15 NOTE — ED Notes (Signed)
Patient transported to CT 

## 2017-05-15 NOTE — ED Notes (Signed)
Patient transported to X-ray 

## 2017-05-15 NOTE — ED Notes (Signed)
States pain is the same ware he is waiting on xray results
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
## 2017-05-18 ENCOUNTER — Ambulatory Visit (INDEPENDENT_AMBULATORY_CARE_PROVIDER_SITE_OTHER): Payer: Self-pay | Admitting: Family Medicine

## 2017-05-18 ENCOUNTER — Ambulatory Visit: Payer: Medicaid Other | Admitting: Family Medicine

## 2017-05-18 VITALS — BP 110/80 | HR 85 | Temp 98.3°F | Ht 70.0 in | Wt 159.6 lb

## 2017-05-18 DIAGNOSIS — M791 Myalgia: Secondary | ICD-10-CM

## 2017-05-18 DIAGNOSIS — M542 Cervicalgia: Secondary | ICD-10-CM

## 2017-05-18 DIAGNOSIS — M7918 Myalgia, other site: Secondary | ICD-10-CM

## 2017-05-18 MED ORDER — MELOXICAM 15 MG PO TABS: 15.0000 mg | ORAL_TABLET | Freq: Every day | ORAL | 0 refills | Status: DC

## 2017-05-18 MED ORDER — KETOROLAC TROMETHAMINE 60 MG/2ML IM SOLN
60.0000 mg | Freq: Once | INTRAMUSCULAR | Status: AC
  Administered 2017-05-18: 60 mg via INTRAMUSCULAR

## 2017-05-18 MED ORDER — CYCLOBENZAPRINE HCL 10 MG PO TABS: 10.0000 mg | ORAL_TABLET | Freq: Two times a day (BID) | ORAL | 0 refills | Status: DC | PRN
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
## 2017-05-18 NOTE — Patient Instructions (Signed)
You will be sore for a couple weeks  Start the Southern Companymobic tomorrow.  Start the flexeril tonight.  Let us know if not improving in 1-2 weeks.  Take care,  Dr Jimmey RalphParker

## 2017-05-25 ENCOUNTER — Telehealth: Payer: Self-pay | Admitting: Family Medicine

## 2017-05-25 NOTE — Telephone Encounter (Signed)
Sure

## 2017-05-25 NOTE — Telephone Encounter (Signed)
LM for patient asking him to try and come at 330pm in order to get through the check in process and be ready to be seen by 4pm.  Michael Fowler,CMA

## 2017-05-25 NOTE — Telephone Encounter (Signed)
Will forward to MD to advise. Alys Dulak,CMA  

## 2017-05-25 NOTE — Telephone Encounter (Signed)
Pt is scheduled to see Dr. Wonda Oldsiccio Wednesday at 2:30, but pt's wife's appointment is @ 4. Pt wants to know if Dr. Wonda Oldsiccio would be willing to see them together @ 4. Please call pt and let him know. Thanks! ep

## 2017-05-27 ENCOUNTER — Encounter: Payer: Self-pay | Admitting: Family Medicine

## 2017-05-27 ENCOUNTER — Other Ambulatory Visit: Payer: Self-pay | Admitting: Family Medicine

## 2017-05-27 ENCOUNTER — Ambulatory Visit (INDEPENDENT_AMBULATORY_CARE_PROVIDER_SITE_OTHER): Payer: Self-pay | Admitting: Family Medicine

## 2017-05-27 DIAGNOSIS — N400 Enlarged prostate without lower urinary tract symptoms: Secondary | ICD-10-CM

## 2017-05-27 MED ORDER — MELOXICAM 15 MG PO TABS: 15.0000 mg | ORAL_TABLET | Freq: Every day | ORAL | 0 refills | Status: DC

## 2017-05-27 MED ORDER — CYCLOBENZAPRINE HCL 10 MG PO TABS: 10.0000 mg | ORAL_TABLET | Freq: Two times a day (BID) | ORAL | 0 refills | Status: DC | PRN

## 2017-05-27 MED ORDER — TRAZODONE HCL 50 MG PO TABS: 25.0000 mg | ORAL_TABLET | Freq: Every evening | ORAL | 3 refills | Status: DC | PRN
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
## 2017-06-24 ENCOUNTER — Other Ambulatory Visit: Payer: Self-pay | Admitting: Family Medicine

## 2017-06-24 MED ORDER — TAMSULOSIN HCL 0.4 MG PO CAPS
0.4000 mg | ORAL_CAPSULE | Freq: Every day | ORAL | 0 refills | Status: DC
Start: 2017-06-24 — End: 2017-09-28

## 2017-06-24 NOTE — Telephone Encounter (Signed)
Pt called and would like a refill on his prostate medication called in. jw

## 2017-09-28 ENCOUNTER — Other Ambulatory Visit: Payer: Self-pay | Admitting: Family Medicine

## 2017-11-04 ENCOUNTER — Ambulatory Visit: Payer: Self-pay | Admitting: Family Medicine

## 2017-11-04 ENCOUNTER — Encounter (HOSPITAL_COMMUNITY): Payer: Self-pay | Admitting: Emergency Medicine

## 2017-11-04 ENCOUNTER — Other Ambulatory Visit: Payer: Self-pay

## 2017-11-04 ENCOUNTER — Emergency Department (HOSPITAL_COMMUNITY): Payer: Medicaid Other

## 2017-11-04 ENCOUNTER — Emergency Department (HOSPITAL_COMMUNITY)
Admission: EM | Admit: 2017-11-04 | Discharge: 2017-11-04 | Disposition: A | Discharge status: Home / Self Care | Payer: Medicaid Other | Attending: Emergency Medicine | Admitting: Emergency Medicine

## 2017-11-04 DIAGNOSIS — I10 Essential (primary) hypertension: Secondary | ICD-10-CM | POA: Insufficient documentation

## 2017-11-04 DIAGNOSIS — Z87891 Personal history of nicotine dependence: Secondary | ICD-10-CM | POA: Insufficient documentation

## 2017-11-04 DIAGNOSIS — R42 Dizziness and giddiness: Secondary | ICD-10-CM | POA: Insufficient documentation

## 2017-11-04 DIAGNOSIS — R002 Palpitations: Secondary | ICD-10-CM | POA: Insufficient documentation

## 2017-11-04 LAB — BASIC METABOLIC PANEL
ANION GAP: 7 (ref 5–15)
BUN: 7 mg/dL (ref 6–20)
CO2: 27 mmol/L (ref 22–32)
Calcium: 8.7 mg/dL — ABNORMAL LOW (ref 8.9–10.3)
Chloride: 101 mmol/L (ref 101–111)
Creatinine, Ser: 0.84 mg/dL (ref 0.61–1.24)
GFR calc non Af Amer: >60 mL/min (ref >60)
GLUCOSE: 159 mg/dL — AB (ref 65–99)
POTASSIUM: 3.5 mmol/L (ref 3.5–5.1)
Sodium: 135 mmol/L (ref 135–145)

## 2017-11-04 LAB — CBC
HEMATOCRIT: 46.2 % (ref 39.0–52.0)
HEMOGLOBIN: 16.2 g/dL (ref 13.0–17.0)
MCH: 31.8 pg (ref 26.0–34.0)
MCHC: 35.1 g/dL (ref 30.0–36.0)
MCV: 90.6 fL (ref 78.0–100.0)
Platelets: 183 10*3/uL (ref 150–400)
RBC: 5.1 MIL/uL (ref 4.22–5.81)
RDW: 11.8 % (ref 11.5–15.5)
WBC: 7.2 10*3/uL (ref 4.0–10.5)

## 2017-11-04 LAB — I-STAT TROPONIN, ED
TROPONIN I, POC: 0 ng/mL (ref 0.00–0.08)
Troponin i, poc: 0 ng/mL (ref 0.00–0.08)

## 2017-11-04 MED ORDER — IBUPROFEN 800 MG PO TABS
800.0000 mg | ORAL_TABLET | Freq: Once | ORAL | Status: AC
  Administered 2017-11-04: 800 mg via ORAL
  Filled 2017-11-04: qty 1

## 2017-11-04 MED ORDER — MECLIZINE HCL 12.5 MG PO TABS: 12.5000 mg | ORAL_TABLET | Freq: Three times a day (TID) | ORAL | 0 refills | Status: DC | PRN

## 2017-11-04 NOTE — Discharge Instructions (Signed)

## 2017-11-04 NOTE — ED Triage Notes (Signed)
Pt to ER for evaluation of palpitations, denies pain, reports dizziness. States is worried about his blood pressure, [facility] and found [facility] blood pressure to be 150's systolic, states called and made appt at PCP today at 2:15 but is worried about his palpitations. NAD. A/O X4.
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
## 2017-12-29 ENCOUNTER — Other Ambulatory Visit: Payer: Self-pay | Admitting: Family Medicine

## 2018-04-01 ENCOUNTER — Other Ambulatory Visit: Payer: Self-pay | Admitting: Family Medicine

## 2018-04-07 ENCOUNTER — Ambulatory Visit: Payer: Medicaid Other | Admitting: Family Medicine

## 2018-07-07 ENCOUNTER — Ambulatory Visit: Payer: Medicaid Other | Admitting: Family Medicine

## 2018-07-07 ENCOUNTER — Other Ambulatory Visit: Payer: Self-pay

## 2018-07-07 ENCOUNTER — Encounter: Payer: Self-pay | Admitting: Family Medicine

## 2018-07-07 VITALS — BP 124/82 | HR 70 | Temp 98.5°F | Ht 70.0 in | Wt 164.0 lb

## 2018-07-07 DIAGNOSIS — R51 Headache: Secondary | ICD-10-CM | POA: Diagnosis not present

## 2018-07-07 DIAGNOSIS — R42 Dizziness and giddiness: Secondary | ICD-10-CM

## 2018-07-07 DIAGNOSIS — K219 Gastro-esophageal reflux disease without esophagitis: Secondary | ICD-10-CM | POA: Diagnosis not present

## 2018-07-07 DIAGNOSIS — R519 Headache, unspecified: Secondary | ICD-10-CM

## 2018-07-07 DIAGNOSIS — R0782 Intercostal pain: Secondary | ICD-10-CM

## 2018-07-07 DIAGNOSIS — E559 Vitamin D deficiency, unspecified: Secondary | ICD-10-CM | POA: Diagnosis not present

## 2018-07-07 DIAGNOSIS — Z0000 Encounter for general adult medical examination without abnormal findings: Secondary | ICD-10-CM | POA: Diagnosis not present

## 2018-07-07 MED ORDER — OMEPRAZOLE MAGNESIUM 20 MG PO TBEC: 20.0000 mg | DELAYED_RELEASE_TABLET | Freq: Every day | ORAL | 3 refills | Status: DC

## 2018-07-07 NOTE — Progress Notes (Signed)
   Subjective:    Patient ID: Burman BlacksmithSufi Hassan Sheikh-Mohamed is a 52 y.o. male presenting with Dizziness (heartburn, headache)  on 07/07/2018  HPI: Went to [employer] and noted SBP is 144. Feels like he has chest pain. Has h/o GERD. Has headache and dizziness. Notes no illness. Has remote h/o headache.  Headache is frontal and has some dizziness if looking down. Improves with lying down. Thinks he has some SOB. No exertional chest pain. Exercises daily with walking and stationary bike riding and no symptoms at all with this. Worse with eating or lying down. Feels gassy at times.  Review of Systems  Constitutional: Negative for chills and fever.  Respiratory: Positive for shortness of breath.   Cardiovascular: Positive for chest pain. Negative for leg swelling.  Gastrointestinal: Negative for abdominal pain, nausea and vomiting.  Neurological: Positive for dizziness and headaches.      Objective:    BP 124/82   Pulse 70   Temp 98.5 F (36.9 C) (Oral)   Ht 5\' 10"  (1.778 m)   Wt 164 lb (74.4 kg)   SpO2 98%   BMI 23.53 kg/m  Physical Exam  Constitutional: He appears well-developed and well-nourished. No distress.  HENT:  Head: Normocephalic and atraumatic.  Right Ear: Tympanic membrane, external ear and ear canal normal.  Left Ear: Tympanic membrane, external ear and ear canal normal.  Nose: No mucosal edema or rhinorrhea. Right sinus exhibits no maxillary sinus tenderness and no frontal sinus tenderness. Left sinus exhibits no maxillary sinus tenderness and no frontal sinus tenderness.  Mouth/Throat: Oropharynx is clear and moist and mucous membranes are normal.  Eyes: No scleral icterus.  Neck: Neck supple.  Cardiovascular: Normal rate and regular rhythm.  No murmur heard. Pulmonary/Chest: Effort normal and breath sounds normal.  Abdominal: Soft. He exhibits no distension and no mass. There is no tenderness. There is no guarding.  Musculoskeletal: He exhibits no edema.  Neurological:  He is alert.  Skin: Skin is warm.  Psychiatric: He has a normal mood and affect.  Vitals reviewed.       Assessment & Plan:   Problem List Items Addressed This Visit      Unprioritized   Dizziness    Unclear etiology--? Related to sinuses      Gastroesophageal reflux disease without esophagitis    Trial of PPI      Relevant Medications   omeprazole (PRILOSEC OTC) 20 MG tablet   Headache    Has been just for last few days. No worrisome features. Has had in the past and seems to come and go, though not acutely worsening.      Chest pain    Do not believe this is cardiac in nature--will try PPI. Return with any exertional CP. He has no FH, no longer smoking.       Other Visit Diagnoses    Routine medical exam    -  Primary   labs today for CPE with PCP in next few weeks--declines colonoscopy   Relevant Orders   CBC   Comprehensive metabolic panel   TSH   Hemoglobin A1c   Lipid panel   Vitamin D (25 hydroxy)      Total face-to-face time with patient: 15 minutes. Over 50% of encounter was spent on counseling and coordination of care. Return in about 1 month (around 08/04/2018) for a CPE.  Reva Boresanya S Ballard Budney 07/07/2018 4:07 PM

## 2018-07-07 NOTE — Assessment & Plan Note (Signed)
Do not believe this is cardiac in nature--will try PPI. Return with any exertional CP. He has no FH, no longer smoking.

## 2018-07-07 NOTE — Assessment & Plan Note (Addendum)
Has been just for last few days. No worrisome features. Has had in the past and seems to come and go, though not acutely worsening.

## 2018-07-07 NOTE — Assessment & Plan Note (Signed)
Trial of PPI. 

## 2018-07-07 NOTE — Assessment & Plan Note (Signed)
Unclear etiology--? Related to sinuses

## 2018-07-07 NOTE — Patient Instructions (Signed)
Gastroesophageal Reflux Disease, Adult Normally, food travels down the esophagus and stays in the stomach to be digested. However, when a person has gastroesophageal reflux disease (GERD), food and stomach acid move back up into the esophagus. When this happens, the esophagus becomes sore and inflamed. Over time, GERD can create small holes (ulcers) in the lining of the esophagus. What are the causes? This condition is caused by a problem with the muscle between the esophagus and the stomach (lower esophageal sphincter, or LES). Normally, the LES muscle closes after food passes through the esophagus to the stomach. When the LES is weakened or abnormal, it does not close properly, and that allows food and stomach acid to go back up into the esophagus. The LES can be weakened by certain dietary substances, medicines, and medical conditions, including:  Tobacco use.  Pregnancy.  Having a hiatal hernia.  Heavy alcohol use.  Certain foods and beverages, such as coffee, chocolate, onions, and peppermint.  What increases the risk? This condition is more likely to develop in:  People who have an increased body weight.  People who have connective tissue disorders.  People who use NSAID medicines.  What are the signs or symptoms? Symptoms of this condition include:  Heartburn.  Difficult or painful swallowing.  The feeling of having a lump in the throat.  Abitter taste in the mouth.  Bad breath.  Having a large amount of saliva.  Having an upset or bloated stomach.  Belching.  Chest pain.  Shortness of breath or wheezing.  Ongoing (chronic) cough or a night-time cough.  Wearing away of tooth enamel.  Weight loss.  Different conditions can cause chest pain. Make sure to see your health care provider if you experience chest pain. How is this diagnosed? Your health care provider will take a medical history and perform a physical exam. To determine if you have mild or severe  GERD, your health care provider may also monitor how you respond to treatment. You may also have other tests, including:  An endoscopy toexamine your stomach and esophagus with a small camera.  A test thatmeasures the acidity level in your esophagus.  A test thatmeasures how much pressure is on your esophagus.  A barium swallow or modified barium swallow to show the shape, size, and functioning of your esophagus.  How is this treated? The goal of treatment is to help relieve your symptoms and to prevent complications. Treatment for this condition may vary depending on how severe your symptoms are. Your health care provider may recommend:  Changes to your diet.  Medicine.  Surgery.  Follow these instructions at home: Diet  Follow a diet as recommended by your health care provider. This may involve avoiding foods and drinks such as: ? Coffee and tea (with or without caffeine). ? Drinks that containalcohol. ? Energy drinks and sports drinks. ? Carbonated drinks or sodas. ? Chocolate and cocoa. ? Peppermint and mint flavorings. ? Garlic and onions. ? Horseradish. ? Spicy and acidic foods, including peppers, chili powder, curry powder, vinegar, hot sauces, and barbecue sauce. ? Citrus fruit juices and citrus fruits, such as oranges, lemons, and limes. ? Tomato-based foods, such as red sauce, chili, salsa, and pizza with red sauce. ? Fried and fatty foods, such as donuts, french fries, potato chips, and high-fat dressings. ? High-fat meats, such as hot dogs and fatty cuts of red and white meats, such as rib eye steak, sausage, ham, and bacon. ? High-fat dairy items, such as whole milk,   butter, and cream cheese.  Eat small, frequent meals instead of large meals.  Avoid drinking large amounts of liquid with your meals.  Avoid eating meals during the 2-3 hours before bedtime.  Avoid lying down right after you eat.  Do not exercise right after you eat. General  instructions  Pay attention to any changes in your symptoms.  Take over-the-counter and prescription medicines only as told by your health care provider. Do not take aspirin, ibuprofen, or other NSAIDs unless your health care provider told you to do so.  Do not use any tobacco products, including cigarettes, chewing tobacco, and e-cigarettes. If you need help quitting, ask your health care provider.  Wear loose-fitting clothing. Do not wear anything tight around your waist that causes pressure on your abdomen.  Raise (elevate) the head of your bed 6 inches (15cm).  Try to reduce your stress, such as with yoga or meditation. If you need help reducing stress, ask your health care provider.  If you are overweight, reduce your weight to an amount that is healthy for you. Ask your health care provider for guidance about a safe weight loss goal.  Keep all follow-up visits as told by your health care provider. This is important. Contact a health care provider if:  You have new symptoms.  You have unexplained weight loss.  You have difficulty swallowing, or it hurts to swallow.  You have wheezing or a persistent cough.  Your symptoms do not improve with treatment.  You have a hoarse voice. Get help right away if:  You have pain in your arms, neck, jaw, teeth, or back.  You feel sweaty, dizzy, or light-headed.  You have chest pain or shortness of breath.  You vomit and your vomit looks like blood or coffee grounds.  You faint.  Your stool is bloody or black.  You cannot swallow, drink, or eat. This information is not intended to replace advice given to you by your health care provider. Make sure you discuss any questions you have with your health care provider. Document Released: 08/27/2005 Document Revised: 04/16/2016 Document Reviewed: 03/14/2015 Elsevier Interactive Patient Education  2018 Elsevier Inc.  

## 2018-07-08 ENCOUNTER — Encounter: Payer: Self-pay | Admitting: Family Medicine

## 2018-07-08 LAB — LIPID PANEL
CHOL/HDL RATIO: 5.2 ratio — AB (ref 0.0–5.0)
Cholesterol, Total: 232 mg/dL — ABNORMAL HIGH (ref 100–199)
HDL: 45 mg/dL (ref >39)
LDL Calculated: 167 mg/dL — ABNORMAL HIGH (ref 0–99)
TRIGLYCERIDES: 102 mg/dL (ref 0–149)
VLDL Cholesterol Cal: 20 mg/dL (ref 5–40)

## 2018-07-08 LAB — HEMOGLOBIN A1C
ESTIMATED AVERAGE GLUCOSE: 105 mg/dL
HEMOGLOBIN A1C: 5.3 % (ref 4.8–5.6)

## 2018-07-08 LAB — COMPREHENSIVE METABOLIC PANEL
ALK PHOS: 71 IU/L (ref 39–117)
ALT: 23 IU/L (ref 0–44)
AST: 19 IU/L (ref 0–40)
Albumin/Globulin Ratio: 1.8 (ref 1.2–2.2)
Albumin: 4.4 g/dL (ref 3.5–5.5)
BUN/Creatinine Ratio: 9 (ref 9–20)
BUN: 9 mg/dL (ref 6–24)
Bilirubin Total: 0.6 mg/dL (ref 0.0–1.2)
CALCIUM: 8.7 mg/dL (ref 8.7–10.2)
CO2: 25 mmol/L (ref 20–29)
CREATININE: 1.03 mg/dL (ref 0.76–1.27)
Chloride: 99 mmol/L (ref 96–106)
GFR calc Af Amer: 95 mL/min/{1.73_m2} (ref >59)
GFR, EST NON AFRICAN AMERICAN: 82 mL/min/{1.73_m2} (ref >59)
GLUCOSE: 90 mg/dL (ref 65–99)
Globulin, Total: 2.5 g/dL (ref 1.5–4.5)
Potassium: 3.8 mmol/L (ref 3.5–5.2)
SODIUM: 139 mmol/L (ref 134–144)
Total Protein: 6.9 g/dL (ref 6.0–8.5)

## 2018-07-08 LAB — CBC
Hematocrit: 46.8 % (ref 37.5–51.0)
Hemoglobin: 15.6 g/dL (ref 13.0–17.7)
MCH: 31 pg (ref 26.6–33.0)
MCHC: 33.3 g/dL (ref 31.5–35.7)
MCV: 93 fL (ref 79–97)
PLATELETS: 204 10*3/uL (ref 150–450)
RBC: 5.04 x10E6/uL (ref 4.14–5.80)
RDW: 13.3 % (ref 12.3–15.4)
WBC: 7 10*3/uL (ref 3.4–10.8)

## 2018-07-08 LAB — VITAMIN D 25 HYDROXY (VIT D DEFICIENCY, FRACTURES): VIT D 25 HYDROXY: 11.9 ng/mL — AB (ref 30.0–100.0)

## 2018-07-08 LAB — TSH: TSH: 1.28 u[IU]/mL (ref 0.450–4.500)

## 2018-07-08 MED ORDER — VITAMIN D (ERGOCALCIFEROL) 1.25 MG (50000 UNIT) PO CAPS: 50000.0000 [IU] | ORAL_CAPSULE | ORAL | 0 refills | Status: DC

## 2018-07-08 MED ORDER — CALCIUM CARBONATE-VITAMIN D 500-200 MG-UNIT PO TABS: 1.0000 | ORAL_TABLET | Freq: Two times a day (BID) | ORAL | 3 refills | Status: DC

## 2018-07-08 NOTE — Addendum Note (Signed)
Addended by: Reva BoresPRATT, TANYA S on: 07/08/2018 11:42 AM   Modules accepted: Orders

## 2018-07-19 ENCOUNTER — Other Ambulatory Visit: Payer: Self-pay

## 2018-07-19 MED ORDER — TAMSULOSIN HCL 0.4 MG PO CAPS: 0.4000 mg | ORAL_CAPSULE | Freq: Every day | ORAL | 0 refills | Status: DC

## 2018-10-26 ENCOUNTER — Ambulatory Visit (INDEPENDENT_AMBULATORY_CARE_PROVIDER_SITE_OTHER): Payer: Medicaid Other | Admitting: Family Medicine

## 2018-10-26 ENCOUNTER — Other Ambulatory Visit: Payer: Self-pay | Admitting: Family Medicine

## 2018-10-26 VITALS — BP 110/70 | HR 77 | Temp 98.3°F | Wt 166.6 lb

## 2018-10-26 DIAGNOSIS — J302 Other seasonal allergic rhinitis: Secondary | ICD-10-CM

## 2018-10-26 MED ORDER — CETIRIZINE HCL 10 MG PO TABS: 10.0000 mg | ORAL_TABLET | Freq: Every day | ORAL | 11 refills | Status: DC

## 2018-10-26 MED ORDER — SALINE SPRAY 0.65 % NA SOLN: 1.0000 | NASAL | 0 refills | Status: DC | PRN

## 2018-10-26 NOTE — Patient Instructions (Signed)
It was a pleasure to see you today! Thank you for choosing [facility] for your primary care. Michael Fowler was seen for seasonal allergies. Come back to the clinic if you have routine concerns, and go to the emergency room if you have any lessening emergencies.   Today we saw you in clinic to discuss her seasonal allergies.  You appear safe and not ill.  We are prescribing you some nasal saline spray to help dry out your nose a little bit.  He can also start taking cetirizine to help cut down on your allergic reaction.  Please return immediately if you develop fevers nausea or inability to eat your food comfortably.   Please bring all your medications to every doctors visit   Sign up for My Chart to have easy access to your labs results, and communication with your Primary care physician.     Please check-out at the front desk before leaving the clinic.     Best,  Dr. Marthenia RollingScott Kyliana Standen FAMILY MEDICINE RESIDENT - PGY2 10/26/2018 1:53 PM

## 2018-11-02 DIAGNOSIS — J302 Other seasonal allergic rhinitis: Secondary | ICD-10-CM | POA: Insufficient documentation

## 2018-11-02 NOTE — Progress Notes (Signed)
    Subjective:  Michael Fowler is a 52 y.o. male who presents to the [facility] today with a chief complaint of nasal congestion.   HPI: Patient presents for nasal congestion consistent with seasonal allergies.  This comes around during winter every time this year.  He has had no fever symptoms.  He denies any trouble breathing or changes in appetite he has had no visual effects or ear pain.  No changes in range of motion and no gastrointestinal issues or chest pain.  He says that this is been going on for roughly 2 weeks he has been had a runny nose with no bleeding.  He has been taking over-the-counter medicine and wants to know if we can prescribe something to help out  Objective:  Physical Exam: BP 110/70   Pulse 77   Temp 98.3 F (36.8 C) (Oral)   Wt 166 lb 9.6 oz (75.6 kg)   SpO2 98%   BMI 23.90 kg/m   Gen: NAD, resting comfortably CV: RRR with no murmurs appreciated ENT: No eye involvement, clear conjunctiva, no ear pain, clear TM, no lymphadenopathy, no range of motion changes to neck, no stridor uvula midline, no sinus tenderness. Pulm: NWOB, CTAB with no crackles, wheezes, or rhonchi MSK: no edema, cyanosis, or clubbing noted Skin: warm, dry Neuro: grossly normal, moves all extremities Psych: Normal affect and thought content  No results found for this or any previous visit (from the past 72 hour(s)).   Assessment/Plan:  Seasonal allergies Minor rhinorrhea, with no sick symptoms.  Prescribing over-the-counter symptom control and return precautions discussed   Marthenia RollingScott Shatasha Lambing, DO FAMILY MEDICINE RESIDENT - PGY2 11/02/2018 10:45 PM

## 2018-11-02 NOTE — Assessment & Plan Note (Signed)
Minor rhinorrhea, with no sick symptoms.  Prescribing over-the-counter symptom control and return precautions discussed

## 2018-11-04 ENCOUNTER — Ambulatory Visit: Payer: Medicaid Other | Admitting: Family Medicine

## 2018-12-22 DIAGNOSIS — H2513 Age-related nuclear cataract, bilateral: Secondary | ICD-10-CM | POA: Diagnosis not present

## 2018-12-22 DIAGNOSIS — H524 Presbyopia: Secondary | ICD-10-CM | POA: Diagnosis not present

## 2018-12-22 DIAGNOSIS — H402232 Chronic angle-closure glaucoma, bilateral, moderate stage: Secondary | ICD-10-CM | POA: Diagnosis not present

## 2018-12-22 DIAGNOSIS — H52223 Regular astigmatism, bilateral: Secondary | ICD-10-CM | POA: Diagnosis not present

## 2019-05-25 ENCOUNTER — Encounter: Payer: Self-pay | Admitting: Family Medicine

## 2019-05-25 ENCOUNTER — Other Ambulatory Visit: Payer: Self-pay

## 2019-05-25 ENCOUNTER — Ambulatory Visit: Payer: Medicaid Other | Admitting: Family Medicine

## 2019-05-25 ENCOUNTER — Ambulatory Visit (INDEPENDENT_AMBULATORY_CARE_PROVIDER_SITE_OTHER): Payer: Medicaid Other | Admitting: Family Medicine

## 2019-05-25 VITALS — BP 120/80 | HR 70 | Ht 70.0 in | Wt 168.4 lb

## 2019-05-25 DIAGNOSIS — J019 Acute sinusitis, unspecified: Secondary | ICD-10-CM | POA: Insufficient documentation

## 2019-05-25 MED ORDER — AMOXICILLIN-POT CLAVULANATE 875-125 MG PO TABS: 1.0000 | ORAL_TABLET | Freq: Two times a day (BID) | ORAL | 0 refills | Status: AC

## 2019-05-25 MED ORDER — AFRIN NASAL SPRAY 0.05 % NA SOLN: 1.0000 | Freq: Two times a day (BID) | NASAL | 0 refills | Status: AC

## 2019-05-25 MED ORDER — FLUTICASONE PROPIONATE 50 MCG/ACT NA SUSP: 2.0000 | Freq: Every day | NASAL | 1 refills | Status: DC
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
## 2019-05-25 NOTE — Assessment & Plan Note (Signed)
Clinical symptomatology suggestive of rhinosinusitis. Well-appearing on exam, however note postnasal drainage and boggy nasal mucosa with sinus pressure with palpation.  While is likely a viral source, given history of double worsening must consider bacterial etiology.   - Start Flonase nasal spray daily - Afrin nasal spray twice daily for 3 days, educated on discontinuing this medication after this time - Printed prescription for Augmentin for 5 days, patient to bring to his pharmacy if symptoms not improving with therapy as above in the next week - Continue supportive care including staying well-hydrated and steam inhalation - Follow-up if symptoms not improving or worsening

## 2019-05-25 NOTE — Patient Instructions (Signed)
It was wonderful meeting you today.  For your sinuses, I have sent in a Flonase nasal spray and also Afrin (nasal decongestant spray).  Please only use Afrin for maximum of 3 days, this can become addicting and will cause congestion in the long run.  Please also continue supportive measures such as steam baths, staying well-hydrated.  If this is not effective within the next week, you may start the Augmentin which is a 5-day course.  Follow-up with the symptoms are not improving or worsening.

## 2019-05-26 ENCOUNTER — Other Ambulatory Visit: Payer: Self-pay

## 2019-05-26 DIAGNOSIS — H402232 Chronic angle-closure glaucoma, bilateral, moderate stage: Secondary | ICD-10-CM | POA: Diagnosis not present

## 2019-05-26 DIAGNOSIS — J019 Acute sinusitis, unspecified: Secondary | ICD-10-CM

## 2019-05-26 DIAGNOSIS — H2513 Age-related nuclear cataract, bilateral: Secondary | ICD-10-CM | POA: Diagnosis not present

## 2019-05-26 DIAGNOSIS — H524 Presbyopia: Secondary | ICD-10-CM | POA: Diagnosis not present

## 2019-05-26 DIAGNOSIS — H52223 Regular astigmatism, bilateral: Secondary | ICD-10-CM | POA: Diagnosis not present

## 2019-05-26 MED ORDER — FLUTICASONE PROPIONATE 50 MCG/ACT NA SUSP: 2.0000 | Freq: Every day | NASAL | 1 refills | Status: DC

## 2019-05-26 NOTE — Telephone Encounter (Signed)
Pt requesting a 90 day supply. Sharon T Saunders, CMA  

## 2019-10-21 ENCOUNTER — Encounter: Payer: Self-pay | Admitting: Family Medicine

## 2019-10-21 ENCOUNTER — Ambulatory Visit (INDEPENDENT_AMBULATORY_CARE_PROVIDER_SITE_OTHER): Payer: Medicaid Other | Admitting: Family Medicine

## 2019-10-21 ENCOUNTER — Other Ambulatory Visit: Payer: Self-pay

## 2019-10-21 VITALS — BP 124/72 | HR 80 | Wt 166.0 lb

## 2019-10-21 DIAGNOSIS — E7800 Pure hypercholesterolemia, unspecified: Secondary | ICD-10-CM

## 2019-10-21 DIAGNOSIS — Z1211 Encounter for screening for malignant neoplasm of colon: Secondary | ICD-10-CM | POA: Diagnosis not present

## 2019-10-21 DIAGNOSIS — H34232 Retinal artery branch occlusion, left eye: Secondary | ICD-10-CM

## 2019-10-21 DIAGNOSIS — Z1159 Encounter for screening for other viral diseases: Secondary | ICD-10-CM | POA: Insufficient documentation

## 2019-10-21 DIAGNOSIS — I10 Essential (primary) hypertension: Secondary | ICD-10-CM

## 2019-10-21 DIAGNOSIS — Z0000 Encounter for general adult medical examination without abnormal findings: Secondary | ICD-10-CM

## 2019-10-21 DIAGNOSIS — H348122 Central retinal vein occlusion, left eye, stable: Secondary | ICD-10-CM

## 2019-10-21 NOTE — Assessment & Plan Note (Addendum)
Has experienced significant vision improvement in the past year as noted above.  He currently follows with Dr. Venetia Maxon, and ophthalmologist.  He reports that he has not been instructed to take aspirin or a statin or anything other than an eyedrop for his ongoing eye issues (which also include glaucoma). -Follow-up lipid panel and ASCVD risk -Have release of information paperwork filled out at [name]'s next visit

## 2019-10-21 NOTE — Progress Notes (Signed)
    Subjective:  Michael Fowler is a 53 y.o. male who presents to the [facility] today with a chief complaint of annual physical.   HPI:  Retinal vein occlusion Is previously been diagnosed with retinal artery occlusion and has limited vision in his left eye.  Reports often blurry.  He also notes that within the past year, his vision in his left eye has significantly improved.  Is certainly not back to normal or equal with his right eye but much better than previously.  He has previously been prescribed a statin but does not take it.  Colon cancer screening He has never had colon cancer screening.  Not interested in screening for colon cancer at this time.  He notes that he has faith that God will keep him healthy and that he will not need colon cancer screening.  Hepatitis C screening He has not previously been screened for hepatitis C as far as she knows.  Smoking history He reports smoking 1 pack/day for about 10 years.  He quit smoking about 12 years ago.  Chief Complaint noted Review of Symptoms - see HPI PMH - Smoking status noted.    Objective:  Physical Exam: BP 124/72   Pulse 80   Wt 166 lb (75.3 kg)   SpO2 97%   BMI 23.82 kg/m    Gen: NAD, resting comfortably in chair in exam room. CV: RRR with no murmurs appreciated Pulm: NWOB, CTAB with no crackles, wheezes, or rhonchi GI: Normal bowel sounds present. Soft, Nontender, Nondistended. MSK: no edema, cyanosis, or clubbing noted Skin: warm, dry Neuro: grossly normal, moves all extremities  Psych: Normal affect and thought content  No results found for this or any previous visit (from the past 72 hour(s)).   Assessment/Plan:  Hypertension Well-controlled without medication.  We will continue to monitor at this time. 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  He currently follows with Dr. Venetia Fowler, and  ophthalmologist.  He reports that he has not been instructed to take aspirin or a statin or anything other than an eyedrop for his ongoing eye issues (which also include glaucoma). -Follow-up lipid panel and ASCVD risk -Have release of information paperwork filled out at [name]'s next visit  Encounter for hepatitis C screening test for low risk patient Follow-up hepatitis C screen  Screening for colon cancer He was not interested in having a colonoscopy performed for colon cancer screening.  He reports he is not experiencing any symptoms but does not think that the screening is necessary.  He also places his faith in God will keep him healthy.  He was encouraged to also consider other options for colon cancer screening which include FIT testing.  He is not interested in other options for colon cancer screening at this time.

## 2019-10-21 NOTE — Assessment & Plan Note (Signed)
He was not interested in having a colonoscopy performed for colon cancer screening.  He reports he is not experiencing any symptoms but does not think that the screening is necessary.  He also places his faith in God will keep him healthy.  He was encouraged to also consider other options for colon cancer screening which include FIT testing.  He is not interested in other options for colon cancer screening at this time.

## 2019-10-21 NOTE — Patient Instructions (Addendum)
It was very nice to meet you today.  It looks like you are, generally, very healthy.  Glad to hear that your vision in your left eye has been improving.  He was a quick summary of things we talked about:  Colon cancer screening: I understand that you are not interested in cancer screening due to your faith.  You are welcome to decline the screening although I will continue to note that I recommend you consider screening for colon cancer before any symptoms arise.  Routine blood work: We will follow-up on your kidney function, cholesterol and a hepatitis screen today.  I will let you know if there are any abnormalities.  Is very good to see you today.  Glad you are healthy.  Please come back if anything changes or you are feeling unwell.

## 2019-10-21 NOTE — Assessment & Plan Note (Signed)
Well-controlled without medication.  We will continue to monitor at this time. -Follow-up BMP

## 2019-10-21 NOTE — Progress Notes (Signed)
Note entered in error

## 2019-10-21 NOTE — Assessment & Plan Note (Signed)
Follow-up hepatitis C screen

## 2019-10-22 LAB — BASIC METABOLIC PANEL
BUN/Creatinine Ratio: 15 (ref 9–20)
BUN: 14 mg/dL (ref 6–24)
CO2: 23 mmol/L (ref 20–29)
Calcium: 9 mg/dL (ref 8.7–10.2)
Chloride: 99 mmol/L (ref 96–106)
Creatinine, Ser: 0.91 mg/dL (ref 0.76–1.27)
GFR calc Af Amer: 109 mL/min/{1.73_m2} (ref >59)
GFR calc non Af Amer: 95 mL/min/{1.73_m2} (ref >59)
Glucose: 122 mg/dL — ABNORMAL HIGH (ref 65–99)
Potassium: 3.8 mmol/L (ref 3.5–5.2)
Sodium: 140 mmol/L (ref 134–144)

## 2019-10-22 LAB — HEPATITIS C ANTIBODY: Hep C Virus Ab: <0.1 s/co ratio (ref 0.0–0.9)

## 2019-10-22 LAB — LIPID PANEL
Chol/HDL Ratio: 5.6 ratio — ABNORMAL HIGH (ref 0.0–5.0)
Cholesterol, Total: 237 mg/dL — ABNORMAL HIGH (ref 100–199)
HDL: 42 mg/dL (ref >39)
LDL Chol Calc (NIH): 162 mg/dL — ABNORMAL HIGH (ref 0–99)
Triglycerides: 181 mg/dL — ABNORMAL HIGH (ref 0–149)
VLDL Cholesterol Cal: 33 mg/dL (ref 5–40)

## 2019-10-24 ENCOUNTER — Other Ambulatory Visit: Payer: Self-pay | Admitting: Family Medicine

## 2019-10-24 MED ORDER — ROSUVASTATIN CALCIUM 10 MG PO TABS: 10.0000 mg | ORAL_TABLET | Freq: Every day | ORAL | 3 refills | Status: DC

## 2019-11-11 ENCOUNTER — Other Ambulatory Visit: Payer: Self-pay | Admitting: Family Medicine

## 2019-11-11 DIAGNOSIS — H409 Unspecified glaucoma: Secondary | ICD-10-CM

## 2019-11-11 DIAGNOSIS — K219 Gastro-esophageal reflux disease without esophagitis: Secondary | ICD-10-CM

## 2019-11-11 MED ORDER — LATANOPROST 0.005 % OP SOLN: 1.0000 [drp] | Freq: Every day | OPHTHALMIC | 1 refills | Status: DC

## 2019-11-11 MED ORDER — OMEPRAZOLE MAGNESIUM 20 MG PO TBEC: 20.0000 mg | DELAYED_RELEASE_TABLET | Freq: Every day | ORAL | 3 refills | Status: DC

## 2019-11-11 MED ORDER — TAMSULOSIN HCL 0.4 MG PO CAPS: 0.4000 mg | ORAL_CAPSULE | Freq: Every day | ORAL | 0 refills | Status: DC

## 2019-11-11 NOTE — Telephone Encounter (Signed)
Patient needs refill on Tamsulosin, Omeprazole and his eye drops. He would like 3 month supplies as it is cheaper with his insurance to get the 3 month supply. Please call patient with any questions.

## 2019-12-21 DIAGNOSIS — H402232 Chronic angle-closure glaucoma, bilateral, moderate stage: Secondary | ICD-10-CM | POA: Diagnosis not present

## 2019-12-21 DIAGNOSIS — H04123 Dry eye syndrome of bilateral lacrimal glands: Secondary | ICD-10-CM | POA: Diagnosis not present

## 2019-12-21 DIAGNOSIS — H2513 Age-related nuclear cataract, bilateral: Secondary | ICD-10-CM | POA: Diagnosis not present

## 2020-02-27 ENCOUNTER — Other Ambulatory Visit: Payer: Self-pay | Admitting: *Deleted

## 2020-02-27 MED ORDER — TAMSULOSIN HCL 0.4 MG PO CAPS
0.4000 mg | ORAL_CAPSULE | Freq: Every day | ORAL | 3 refills | Status: DC
Start: 2020-02-27 — End: 2020-12-06

## 2020-02-28 ENCOUNTER — Other Ambulatory Visit: Payer: Self-pay | Admitting: *Deleted

## 2020-02-28 DIAGNOSIS — H409 Unspecified glaucoma: Secondary | ICD-10-CM

## 2020-02-28 NOTE — Telephone Encounter (Signed)
Medication was last prescribed to patient on 11/11/2019. Rilynne Lonsway,CMA

## 2020-02-29 NOTE — Telephone Encounter (Signed)
This is a medication for his glaucoma. He should follow up with his eye doctor for this medication. I am not treating his glaucoma.   Mirian Mo, MD

## 2020-02-29 NOTE — Telephone Encounter (Signed)
Spoke with patient and he will contact his eye doctor for drops.  Candence Sease,CMA

## 2020-06-25 ENCOUNTER — Other Ambulatory Visit: Payer: Self-pay

## 2020-06-25 ENCOUNTER — Ambulatory Visit (INDEPENDENT_AMBULATORY_CARE_PROVIDER_SITE_OTHER): Payer: Medicaid Other

## 2020-06-25 ENCOUNTER — Encounter (HOSPITAL_COMMUNITY): Payer: Self-pay

## 2020-06-25 ENCOUNTER — Ambulatory Visit (HOSPITAL_COMMUNITY)
Admission: EM | Admit: 2020-06-25 | Discharge: 2020-06-25 | Disposition: A | Discharge status: Home / Self Care | Payer: Medicaid Other | Attending: Family Medicine | Admitting: Family Medicine

## 2020-06-25 DIAGNOSIS — S92515A Nondisplaced fracture of proximal phalanx of left lesser toe(s), initial encounter for closed fracture: Secondary | ICD-10-CM | POA: Diagnosis not present

## 2020-06-25 DIAGNOSIS — S92512A Displaced fracture of proximal phalanx of left lesser toe(s), initial encounter for closed fracture: Secondary | ICD-10-CM | POA: Diagnosis not present

## 2020-06-25 DIAGNOSIS — S99922A Unspecified injury of left foot, initial encounter: Secondary | ICD-10-CM | POA: Diagnosis not present

## 2020-06-25 MED ORDER — IBUPROFEN 800 MG PO TABS: 800.0000 mg | ORAL_TABLET | Freq: Three times a day (TID) | ORAL | 0 refills | Status: DC

## 2020-06-25 NOTE — ED Triage Notes (Signed)
Pt is here with a left foot pinkie toe injury that started after hitting his toe on a chair leg on 06/16/2020, pt has taken Advil to relieve discomfort.

## 2020-06-25 NOTE — Discharge Instructions (Addendum)
Use fracture shoe for the next couple of weeks Take Tums for comfort See orthopedist in follow-up Ibuprofen as needed for pain
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
## 2020-06-26 ENCOUNTER — Ambulatory Visit: Payer: Medicaid Other | Admitting: Family Medicine

## 2020-09-17 ENCOUNTER — Encounter: Payer: Self-pay | Admitting: Family Medicine

## 2020-09-17 ENCOUNTER — Other Ambulatory Visit: Payer: Self-pay

## 2020-09-17 ENCOUNTER — Ambulatory Visit: Payer: Medicaid Other | Admitting: Family Medicine

## 2020-09-17 VITALS — BP 124/72 | HR 74 | Wt 169.0 lb

## 2020-09-17 DIAGNOSIS — Z114 Encounter for screening for human immunodeficiency virus [HIV]: Secondary | ICD-10-CM

## 2020-09-17 DIAGNOSIS — Z1211 Encounter for screening for malignant neoplasm of colon: Secondary | ICD-10-CM

## 2020-09-17 DIAGNOSIS — G44209 Tension-type headache, unspecified, not intractable: Secondary | ICD-10-CM

## 2020-09-17 DIAGNOSIS — Z7184 Encounter for health counseling related to travel: Secondary | ICD-10-CM | POA: Diagnosis not present

## 2020-09-17 DIAGNOSIS — J302 Other seasonal allergic rhinitis: Secondary | ICD-10-CM | POA: Diagnosis not present

## 2020-09-17 DIAGNOSIS — K5900 Constipation, unspecified: Secondary | ICD-10-CM

## 2020-09-17 DIAGNOSIS — J019 Acute sinusitis, unspecified: Secondary | ICD-10-CM | POA: Diagnosis not present

## 2020-09-17 DIAGNOSIS — Z0000 Encounter for general adult medical examination without abnormal findings: Secondary | ICD-10-CM

## 2020-09-17 MED ORDER — POLYETHYLENE GLYCOL 3350 17 G PO PACK: 17.0000 g | PACK | Freq: Every day | ORAL | 0 refills | Status: DC

## 2020-09-17 MED ORDER — FLUTICASONE PROPIONATE 50 MCG/ACT NA SUSP: 2.0000 | Freq: Every day | NASAL | 1 refills | Status: AC

## 2020-09-17 MED ORDER — CETIRIZINE HCL 10 MG PO TABS: 10.0000 mg | ORAL_TABLET | Freq: Every day | ORAL | 11 refills | Status: DC

## 2020-09-17 NOTE — Patient Instructions (Addendum)
It was a pleasure to see you today!  1. For headaches: continue to drink plenty of water, you should drink 1-2 liters of water per day. You can alternate aleve and tylenol for headache pain: tylenol every 6 hours, aleve once in the morning, once at night. I also recommend using flonase (nasal spray) once in the morning every morning for 4-6 weeks for best results. Also take the allergy pill zyrtec once daily.  2. For constipation: I recommend you follow up with gastroenterology for a colonoscopy when you return from your family trip. For constipation, start with 1 capfull of miralax in 4-8 oz of water. If you don't have 1 bowel movement per day you can increase to 2 capfuls of miralax in 8 oz of water. If that doesn't work, take 2 capfuls twice a day.  3. For travel: start taking Malarone tomorrow and take every day while abroad, you can stop taking 1 week after your return to the [place]. Please get COVID-19 testing done tomorrow.  Have a great trip!  Dr. Leary Roca

## 2020-09-18 LAB — HIV ANTIBODY (ROUTINE TESTING W REFLEX): HIV Screen 4th Generation wRfx: NONREACTIVE

## 2020-09-18 LAB — BASIC METABOLIC PANEL
BUN/Creatinine Ratio: 11 (ref 9–20)
BUN: 9 mg/dL (ref 6–24)
CO2: 26 mmol/L (ref 20–29)
Calcium: 8.8 mg/dL (ref 8.7–10.2)
Chloride: 98 mmol/L (ref 96–106)
Creatinine, Ser: 0.84 mg/dL (ref 0.76–1.27)
GFR calc Af Amer: 113 mL/min/{1.73_m2} (ref >59)
GFR calc non Af Amer: 98 mL/min/{1.73_m2} (ref >59)
Glucose: 90 mg/dL (ref 65–99)
Potassium: 3.8 mmol/L (ref 3.5–5.2)
Sodium: 137 mmol/L (ref 134–144)

## 2020-09-24 ENCOUNTER — Encounter: Payer: Self-pay | Admitting: Family Medicine

## 2020-10-08 DIAGNOSIS — Z0000 Encounter for general adult medical examination without abnormal findings: Secondary | ICD-10-CM | POA: Insufficient documentation

## 2020-10-08 DIAGNOSIS — Z7184 Encounter for health counseling related to travel: Secondary | ICD-10-CM | POA: Insufficient documentation

## 2020-10-08 NOTE — Assessment & Plan Note (Signed)
Recommended patient get vaccines for Tdap updated, yellow fever if never done (patient believes it was), recommended COVID-19 vaccine. We discussed at length the data and importance of the vaccine. Patient reports that he opts not to receive the vaccine and that "God will protect me." Malarone recommended for travel to [place] to prevent malaria. Start taking medication 1-2 days prior to travel, every day while in a malaria endemic area, and then for 7 days after returning from travel. Patient's wife has enough malarone of appropriate dose for traveling.

## 2020-10-08 NOTE — Assessment & Plan Note (Signed)
Patient has constipation. Not using any opiates. Recommend miralax, given instruction on how to titrate for effect of 1 BM per day. If desired effect not achieved, recommend patient return for follow up.

## 2020-10-08 NOTE — Assessment & Plan Note (Signed)
Patient willing to go for screening colonoscopy. Will refer to GI.

## 2020-10-08 NOTE — Assessment & Plan Note (Signed)
Patient reports recent sinus headaches and has some rhinorrhea with edematous sinus passages. He has used flonase and zyrtec before and had good results, but does not have any of these medications anymore. Will refill.

## 2020-10-08 NOTE — Progress Notes (Signed)
SUBJECTIVE:   CHIEF COMPLAINT / HPI:  Headaches, constipation  Headaches: patient reports that he occasionally has head aches 2-3x per week. The headaches happen randomly; pain is mild and throbbing. No nausea or photophobia Mostly in the front of his head on both sides. No aura, no rhinorrhea or lacrimation. He reports that using tylenol or aleve is helpful and his headaches respond to these medications. He notices that in the fall he has more headaches and a stuffy nose. He has previously used flonase and antihistamines, but does not have any at present.  Constipation: patient reports that he sometimes can go 2-4 days without a BM and then has some abdominal pain that is improved when he finally does have a BM. This pattern has occurred for a long time. He also strains when using the bathroom. He wants to know if there is something he can use to help his BM pattern. He is age 54yo and has never had a colonoscopy. Will refer him for colonoscopy.  Patient is traveling home to [place] with his wife to visit family. He is leaving in 2 days and will return in mid November. I previously recommended that patient go to the [facility] travel clinic for vaccines. Patient is not interested in the COVID-19 vaccine, we discussed the data and improtance of COVID-19 vaccine in the pandemic as well as where to get testing for traveling.  PERTINENT  PMH / PSH: seasonal allergies, HTN, HLD  OBJECTIVE:   BP 124/72   Pulse 74   Wt 169 lb (76.7 kg)   SpO2 96%   BMI 24.25 kg/m   Physical Exam Vitals and nursing note reviewed.  Constitutional:      General: He is not in acute distress.    Appearance: He is well-developed. He is obese. He is not ill-appearing, toxic-appearing or diaphoretic.  HENT:     Head: Normocephalic and atraumatic.     Mouth/Throat:     Mouth: Mucous membranes are moist.     Pharynx: Oropharynx is clear.     Comments: Bilateral sinus passages are mildly edematous with thin, clear  secretions Eyes:     Pupils: Pupils are equal, round, and reactive to light.  Cardiovascular:     Rate and Rhythm: Normal rate and regular rhythm.     Heart sounds: Normal heart sounds.  Pulmonary:     Effort: Pulmonary effort is normal.     Breath sounds: Normal breath sounds.  Abdominal:     General: Bowel sounds are normal. There is no distension.     Palpations: Abdomen is soft. There is no mass.     Tenderness: There is no abdominal tenderness.  Musculoskeletal:        General: No swelling.  Skin:    General: Skin is warm and dry.  Neurological:     Mental Status: He is alert.     GCS: GCS eye subscore is 4. GCS verbal subscore is 5. GCS motor subscore is 6.     Cranial Nerves: No cranial nerve deficit, dysarthria or facial asymmetry.     Sensory: No sensory deficit.     Motor: No weakness.     Gait: Gait normal.     Deep Tendon Reflexes: Reflexes normal.  Psychiatric:        Mood and Affect: Mood normal. Mood is not anxious or depressed.        Speech: Speech normal.        Behavior: Behavior normal.  ASSESSMENT/PLAN:   No problem-specific Assessment & Plan notes found for this encounter.     Shirlean Mylar, MD [facility]

## 2020-10-08 NOTE — Assessment & Plan Note (Signed)
HIV screen performed as part of recommended quality screening. NR.

## 2020-10-08 NOTE — Assessment & Plan Note (Signed)
HA most likely related to seasonal allergies as patient has most pain in sinuses. Recommend using flonase and zyrtec. Patient was also potentially using too much NSAID. Counseled on how to take NSAIDs safely, will check BMP to ensure appropriate cr. Also recommend hydration.

## 2020-11-05 ENCOUNTER — Ambulatory Visit
Admission: RE | Admit: 2020-11-05 | Discharge: 2020-11-05 | Disposition: A | Discharge status: Home / Self Care | Payer: Medicaid Other | Source: Ambulatory Visit | Attending: Family Medicine | Admitting: Family Medicine

## 2020-11-05 ENCOUNTER — Other Ambulatory Visit: Payer: Self-pay

## 2020-11-05 ENCOUNTER — Ambulatory Visit (INDEPENDENT_AMBULATORY_CARE_PROVIDER_SITE_OTHER): Payer: Medicaid Other | Admitting: Family Medicine

## 2020-11-05 VITALS — BP 132/74 | HR 83

## 2020-11-05 DIAGNOSIS — M940 Chondrocostal junction syndrome [Tietze]: Secondary | ICD-10-CM | POA: Insufficient documentation

## 2020-11-05 DIAGNOSIS — R5383 Other fatigue: Secondary | ICD-10-CM | POA: Diagnosis not present

## 2020-11-05 DIAGNOSIS — R059 Cough, unspecified: Secondary | ICD-10-CM | POA: Diagnosis not present

## 2020-11-05 DIAGNOSIS — R053 Chronic cough: Secondary | ICD-10-CM | POA: Insufficient documentation

## 2020-11-05 MED ORDER — GUAIFENESIN 100 MG/5ML PO SOLN: 5.0000 mL | ORAL | 0 refills | Status: DC | PRN

## 2020-11-05 NOTE — Progress Notes (Signed)
    SUBJECTIVE:   CHIEF COMPLAINT / HPI:   Persistent cough Michael Fowler presents to clinic today for a persistent cough for the past 3-4 weeks.  He was last seen in clinic prior to his departure to [place] where he spends a total of 25 days.  During his time in [place], he took malaria prophylaxis consistently.  Despite his prophylaxis, he did develop infectious symptoms including GI and respiratory symptoms which ultimately improved.  He was seen and assessed by a pharmacist in [place] who prescribed medicine which Mr. Sheik could not recall.  His only remaining symptoms include a persistent, mildly productive cough with clear sputum (no blood) and mild fatigue.  In addition to the above-noted symptoms, he also notes some mild, sharp left-sided chest pain which starts just left of his the sternal border does not radiate to his arm, back, jaw.  He is only noticed this discomfort while sitting and does not notice that it worsens at all with exertion.  He is not noticed that this improves with any medication.  He has no significant heart history.  PERTINENT  PMH / PSH: Recent travel  OBJECTIVE:   BP 132/74   Pulse 83   SpO2 97%    General: Alert and cooperative and appears to be in no acute distress HEENT: Neck non-tender without lymphadenopathy, masses or thyromegaly moist mucous membranes. Cardio: Normal S1 and S2, no S3 or S4. Rhythm is regular. No murmurs or rubs.   Pulm: Clear to auscultation bilaterally, no crackles, wheezing, or diminished breath sounds.  Good air movement throughout.  Normal respiratory effort. Abdomen: Bowel sounds normal. Abdomen soft and non-tender.  Extremities: No peripheral edema. Warm/ well perfused.  Strong radial pulse. Neuro: Cranial nerves grossly intact  ASSESSMENT/PLAN:   Persistent cough Based on his story, the differential for his persistent cough is quite large.  Due to his recent travel, it is possible that he may have had exposure to tropical  diseases like tuberculosis or malaria.  Recent travel aside, this may be an atypical pneumonia.  I generally have a low suspicion for yellow fever at this time although that also appears to be endemic to [place].  It is possible this is simply of a more benign etiology like a post infectious cough but I think it is appropriate to rule out more serious causes before settling on a benign diagnosis. -Follow-up CBC, CMP, TSH, QuantiFERON gold -Follow-up chest x-ray  Costochondritis Overall, his chest pain is atypical.  Does not seem to be related to exertion nor is the character consistent with chest pain typically associated with cardiac pathology.  Physical exam is consistent with a musculoskeletal etiology.  I think this is mostly costochondritis.  As he has no significant cardiac history, I will not move forward with any additional work-up for the chest pain at this time.     Mirian Mo, MD [facility]

## 2020-11-05 NOTE — Assessment & Plan Note (Addendum)
Based on his story, the differential for his persistent cough is quite large.  Due to his recent travel, it is possible that he may have had exposure to tropical diseases like tuberculosis or malaria.  Recent travel aside, this may be an atypical pneumonia.  I generally have a low suspicion for yellow fever at this time although that also appears to be endemic to [place].  It is possible this is simply of a more benign etiology like a post infectious cough but I think it is appropriate to rule out more serious causes before settling on a benign diagnosis. -Follow-up CBC, CMP, TSH, QuantiFERON gold -Follow-up chest x-ray

## 2020-11-05 NOTE — Patient Instructions (Signed)
Prolonged cough: I am sorry that you been having this prolonged cough.  Based on your recent travel history, I think that we should do a little bit of a work-up to make sure that you do not have any unusual infections.  If everything is normal, I think that this is most likely related to a postinfectious cough can take 6 or more weeks to go away.  We will get some blood work today and also a chest x-ray.  You can walk-in to [facility] imaging at your convenience to have the chest x-ray performed.  I will let you know the results of your tests.  For now, I recommend that you use over-the-counter cough medicine for symptom relief.

## 2020-11-05 NOTE — Assessment & Plan Note (Signed)
Overall, his chest pain is atypical.  Does not seem to be related to exertion nor is the character consistent with chest pain typically associated with cardiac pathology.  Physical exam is consistent with a musculoskeletal etiology.  I think this is mostly costochondritis.  As he has no significant cardiac history, I will not move forward with any additional work-up for the chest pain at this time.

## 2020-11-07 LAB — COMPREHENSIVE METABOLIC PANEL
ALT: 61 IU/L — ABNORMAL HIGH (ref 0–44)
AST: 35 IU/L (ref 0–40)
Albumin/Globulin Ratio: 1.6 (ref 1.2–2.2)
Albumin: 4.6 g/dL (ref 3.8–4.9)
Alkaline Phosphatase: 97 IU/L (ref 44–121)
BUN/Creatinine Ratio: 15 (ref 9–20)
BUN: 14 mg/dL (ref 6–24)
Bilirubin Total: 0.4 mg/dL (ref 0.0–1.2)
CO2: 24 mmol/L (ref 20–29)
Calcium: 9.2 mg/dL (ref 8.7–10.2)
Chloride: 102 mmol/L (ref 96–106)
Creatinine, Ser: 0.93 mg/dL (ref 0.76–1.27)
GFR calc Af Amer: 106 mL/min/{1.73_m2} (ref >59)
GFR calc non Af Amer: 91 mL/min/{1.73_m2} (ref >59)
Globulin, Total: 2.8 g/dL (ref 1.5–4.5)
Glucose: 100 mg/dL — ABNORMAL HIGH (ref 65–99)
Potassium: 4 mmol/L (ref 3.5–5.2)
Sodium: 140 mmol/L (ref 134–144)
Total Protein: 7.4 g/dL (ref 6.0–8.5)

## 2020-11-07 LAB — CBC
Hematocrit: 46 % (ref 37.5–51.0)
Hemoglobin: 15.5 g/dL (ref 13.0–17.7)
MCH: 30.9 pg (ref 26.6–33.0)
MCHC: 33.7 g/dL (ref 31.5–35.7)
MCV: 92 fL (ref 79–97)
Platelets: 242 10*3/uL (ref 150–450)
RBC: 5.02 x10E6/uL (ref 4.14–5.80)
RDW: 12.1 % (ref 11.6–15.4)
WBC: 7.2 10*3/uL (ref 3.4–10.8)

## 2020-11-07 LAB — QUANTIFERON-TB GOLD PLUS
QuantiFERON Mitogen Value: >10 IU/mL
QuantiFERON Nil Value: 0 IU/mL
QuantiFERON TB1 Ag Value: >10 IU/mL
QuantiFERON TB2 Ag Value: 3.67 IU/mL
QuantiFERON-TB Gold Plus: POSITIVE — AB

## 2020-11-07 LAB — TSH: TSH: 1.9 u[IU]/mL (ref 0.450–4.500)

## 2020-11-09 ENCOUNTER — Other Ambulatory Visit: Payer: Self-pay | Admitting: Family Medicine

## 2020-11-09 NOTE — Progress Notes (Signed)
Called and spoke with Michael Southward, RN at the health department.  After discussing the patient's story, Ms. Michael Fowler noted that it would be appropriate to move forward with sputum testing to diagnose it possible tuberculosis infection.  She noted that the health department would be contacting him to arrange this.  I called [name] and informed him of his lab results and the possibility of a tuberculosis infection.  He was informed to the health department will be calling to arrange further testing.

## 2020-11-19 ENCOUNTER — Telehealth: Payer: Self-pay | Admitting: *Deleted

## 2020-11-19 NOTE — Telephone Encounter (Signed)
Pt would like to know the results of his xray. To PCP. Darnisha Vernet, CMA  

## 2020-11-19 NOTE — Telephone Encounter (Signed)
We discussed his results when I called him in 12/10.  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  I am working nights at the moment.  Please call and relay this information to Mr. Shiekh.  Let me know if there are additional questions.  Mirian Mo, MD

## 2020-11-20 NOTE — Telephone Encounter (Signed)
Spoke with pt. Informed him of the message below. He stated that he does remember talking about that with the doctor. He also stated that he will call the Health Department to make that follow up appt. Aquilla Solian, CMA

## 2020-12-06 ENCOUNTER — Telehealth: Payer: Self-pay | Admitting: Family Medicine

## 2020-12-06 ENCOUNTER — Other Ambulatory Visit: Payer: Self-pay | Admitting: Family Medicine

## 2020-12-06 MED ORDER — TAMSULOSIN HCL 0.4 MG PO CAPS
0.4000 mg | ORAL_CAPSULE | Freq: Every day | ORAL | 3 refills | Status: DC
Start: 2020-12-06 — End: 2020-12-10

## 2020-12-06 NOTE — Telephone Encounter (Signed)
Patient is calling and would like to have his tamsulosin refilled.

## 2020-12-10 ENCOUNTER — Other Ambulatory Visit: Payer: Self-pay

## 2020-12-10 ENCOUNTER — Telehealth: Payer: Self-pay | Admitting: Family Medicine

## 2020-12-10 NOTE — Telephone Encounter (Signed)
Patient is needing a faculty doctor to call in his Flomax Medicaid will not pay for it with the resident calling it in. Thanks

## 2020-12-10 NOTE — Telephone Encounter (Signed)
Medication refill was sent to PCP today. Aquilla Solian, CMA

## 2020-12-11 MED ORDER — TAMSULOSIN HCL 0.4 MG PO CAPS
0.4000 mg | ORAL_CAPSULE | Freq: Every day | ORAL | 3 refills | Status: DC
Start: 2020-12-11 — End: 2021-12-05

## 2020-12-11 NOTE — Telephone Encounter (Signed)
Will forward to preceptor as Medicaid will only pay for an attending prescriber.

## 2020-12-24 ENCOUNTER — Other Ambulatory Visit: Payer: Self-pay

## 2020-12-24 DIAGNOSIS — K219 Gastro-esophageal reflux disease without esophagitis: Secondary | ICD-10-CM

## 2020-12-25 MED ORDER — OMEPRAZOLE MAGNESIUM 20 MG PO TBEC: 20.0000 mg | DELAYED_RELEASE_TABLET | Freq: Every day | ORAL | 3 refills | Status: DC

## 2021-04-10 ENCOUNTER — Other Ambulatory Visit: Payer: Self-pay

## 2021-04-10 ENCOUNTER — Encounter: Payer: Self-pay | Admitting: Family Medicine

## 2021-04-10 ENCOUNTER — Ambulatory Visit: Payer: Medicaid Other | Admitting: Family Medicine

## 2021-04-10 VITALS — BP 124/74 | HR 89 | Ht 70.0 in | Wt 170.0 lb

## 2021-04-10 DIAGNOSIS — R1013 Epigastric pain: Secondary | ICD-10-CM | POA: Diagnosis present

## 2021-04-10 MED ORDER — FAMOTIDINE 20 MG PO TABS
20.0000 mg | ORAL_TABLET | Freq: Two times a day (BID) | ORAL | 1 refills | Status: DC
Start: 2021-04-10 — End: 2021-12-19

## 2021-04-10 MED ORDER — FAMOTIDINE 20 MG PO TABS: 20.0000 mg | ORAL_TABLET | Freq: Two times a day (BID) | ORAL | 1 refills | Status: DC

## 2021-04-10 NOTE — Assessment & Plan Note (Signed)
He is primarily concerned about his cough.  I agree with his own assessment that this seems to be related to some symptoms of reflux.  His reflux, along with abdominal bloating and occasional loose stools and generalized abdominal pain creates a picture of dyspepsia.  I do think would be a good idea to rule out H. pylori infection.  If his H. pylori test is negative, I may still send him to GI for possible upper scope. -Stop omeprazole -Start famotidine 20 mg twice daily until we are able to do an H. pylori test. -Stop famotidine 2 days prior to testing -Return to clinic in 2-3 weeks for an H. pylori test

## 2021-04-10 NOTE — Patient Instructions (Signed)
Cough: I agree with you, this does sound like it might be from reflux.  I would like to test you for a possible infection of your stomach that can lead to some of the symptoms you are talking about.  I am going to test you for something called H. pylori.   -Please stop taking your omeprazole until we have completed your test.   -I have prescribed you a new medication called famotidine which you can take twice a day to help with your stomach discomfort and cough.   -Please come back to clinic in 2-3 weeks for the test.

## 2021-04-10 NOTE — Progress Notes (Signed)
    SUBJECTIVE:   CHIEF COMPLAINT / HPI:   Dyspepsia Michael Fowler noted that he started having trouble with coughing about 3 weeks ago, in the middle of Ramadan.  This cough seems to be worse at night when he is lying down.  He is suspicious that it might be related to gastric reflux.  It seems to be exacerbated by meals.  He also notes significant gassiness and occasional loose stools and mild, generalized abdominal discomfort.  He has previously emigrated from [place], I believe [place] visits occasionally.  He was last there for about 1 month about 6 months ago.  PERTINENT  PMH / PSH: GERD  OBJECTIVE:   BP 124/74   Pulse 89   Ht 5\' 10"  (1.778 m)   Wt 170 lb (77.1 kg)   SpO2 97%   BMI 24.39 kg/m    General: Alert and cooperative and appears to be in no acute distress Cardio: Normal S1 and S2, no S3 or S4. Rhythm is regular. No murmurs or rubs.   Pulm: Clear to auscultation bilaterally, no crackles, wheezing, or diminished breath sounds. Normal respiratory effort Abdomen: Bowel sounds normal.  Mild tenderness to palpation diffusely, including the epigastric region. Extremities: No peripheral edema. Warm/ well perfused.  Strong radial pulses. Neuro: Cranial nerves grossly intact  ASSESSMENT/PLAN:   Dyspepsia He is primarily concerned about his cough.  I agree with his own assessment that this seems to be related to some symptoms of reflux.  His reflux, along with abdominal bloating and occasional loose stools and generalized abdominal pain creates a picture of dyspepsia.  I do think would be a good idea to rule out H. pylori infection.  If his H. pylori test is negative, I may still send him to GI for possible upper scope. -Stop omeprazole -Start famotidine 20 mg twice daily until we are able to do an H. pylori test. -Stop famotidine 2 days prior to testing -Return to clinic in 2-3 weeks for an H. pylori test     , MD [facility]

## 2021-04-10 NOTE — Telephone Encounter (Signed)
Patient calls nurse line due to issues with insurance covering famotidine. Dr. Homero Fellers is not registered with Medicaid, therefore, rx needs to be called in by [facility] registered provider.   Will forward to precepting provider to resend.   Veronda Prude, RN

## 2021-04-24 ENCOUNTER — Other Ambulatory Visit: Payer: Medicaid Other

## 2021-04-24 IMAGING — CR DG CHEST 2V
2 series · 2 of 2 positions shown · non-contrast
Comparison: Radiographs 11/04/2017.  CT 02/08/2017.

CLINICAL DATA: Prolonged cough for 2 weeks.

EXAM:
CHEST - 2 VIEW

[w chest pa]
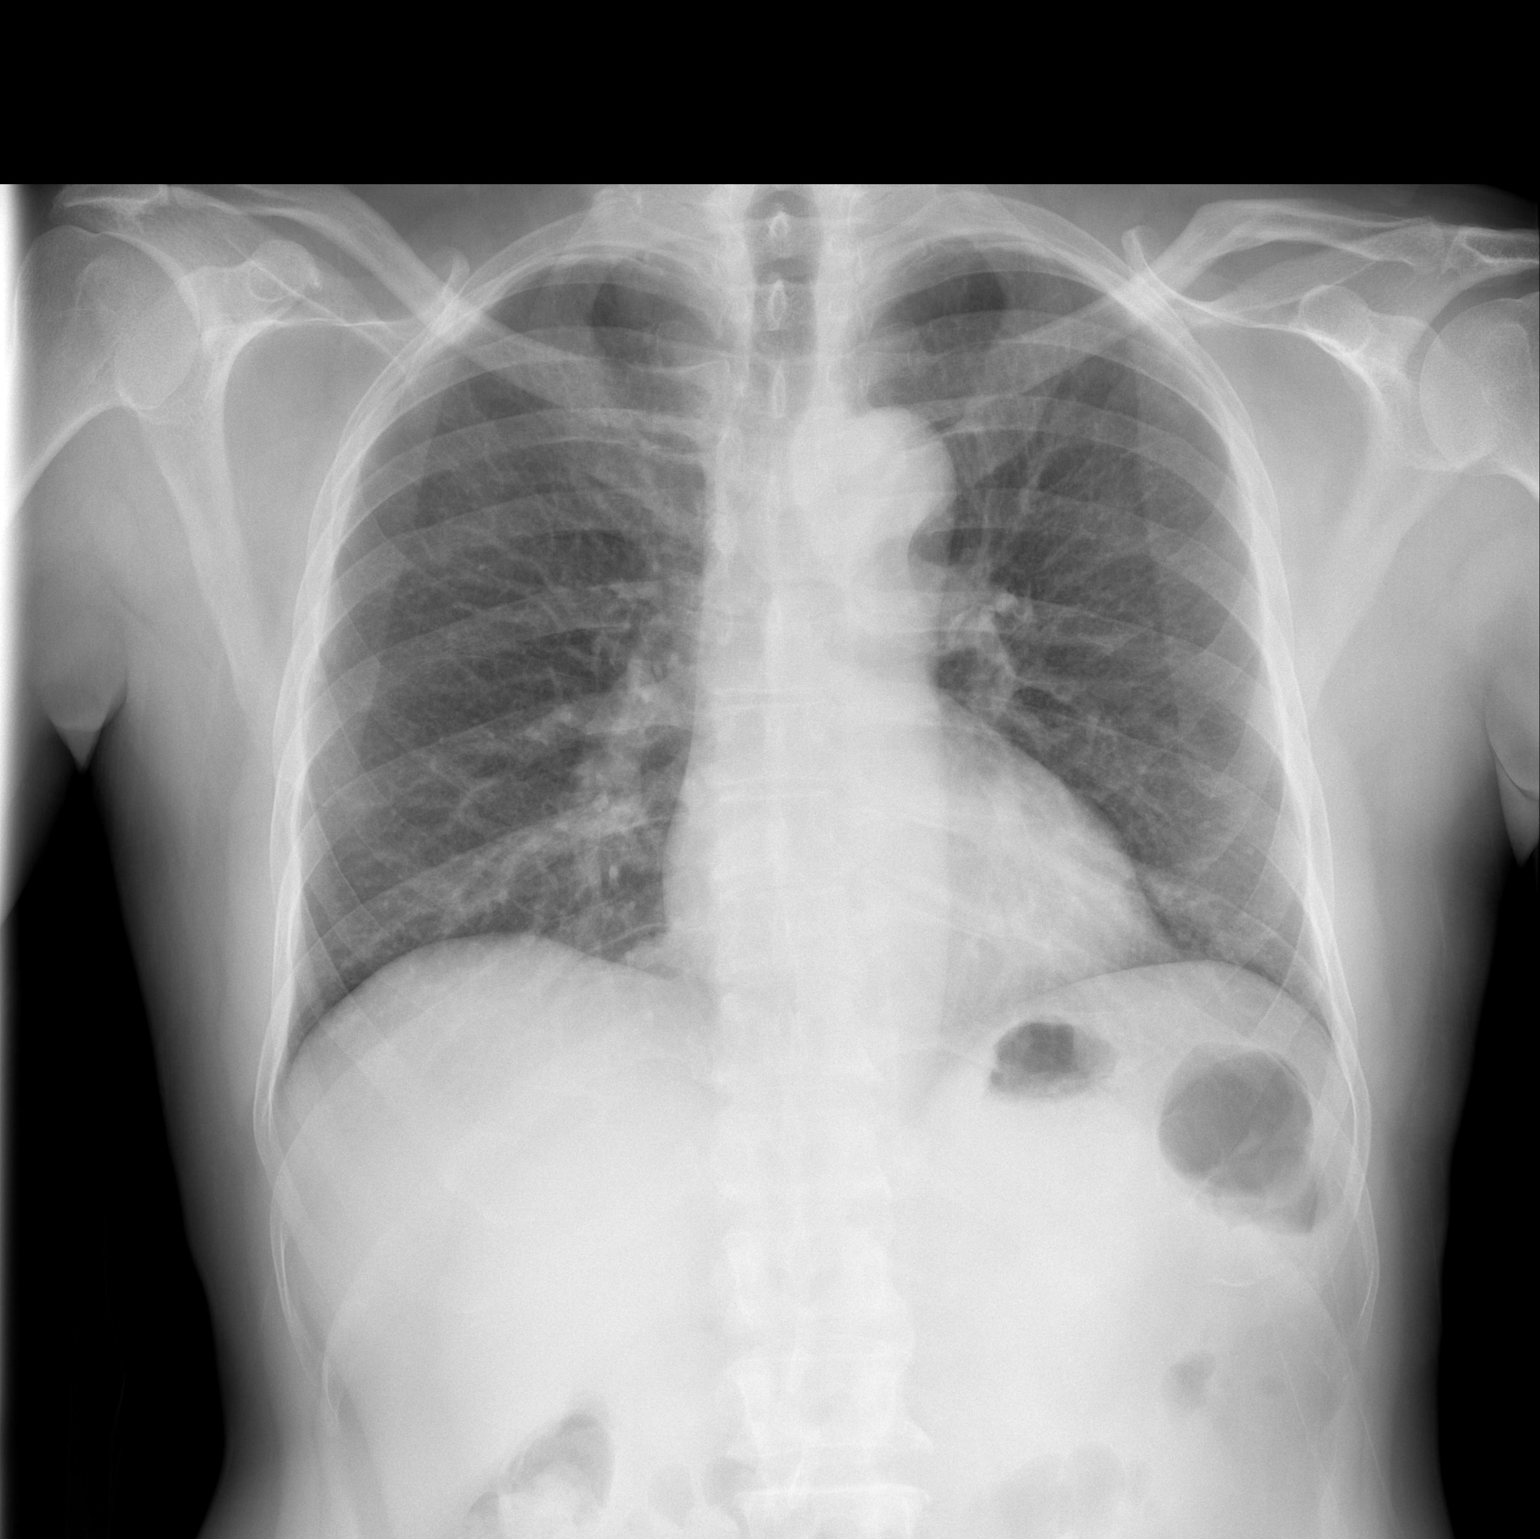

[w chest lat]
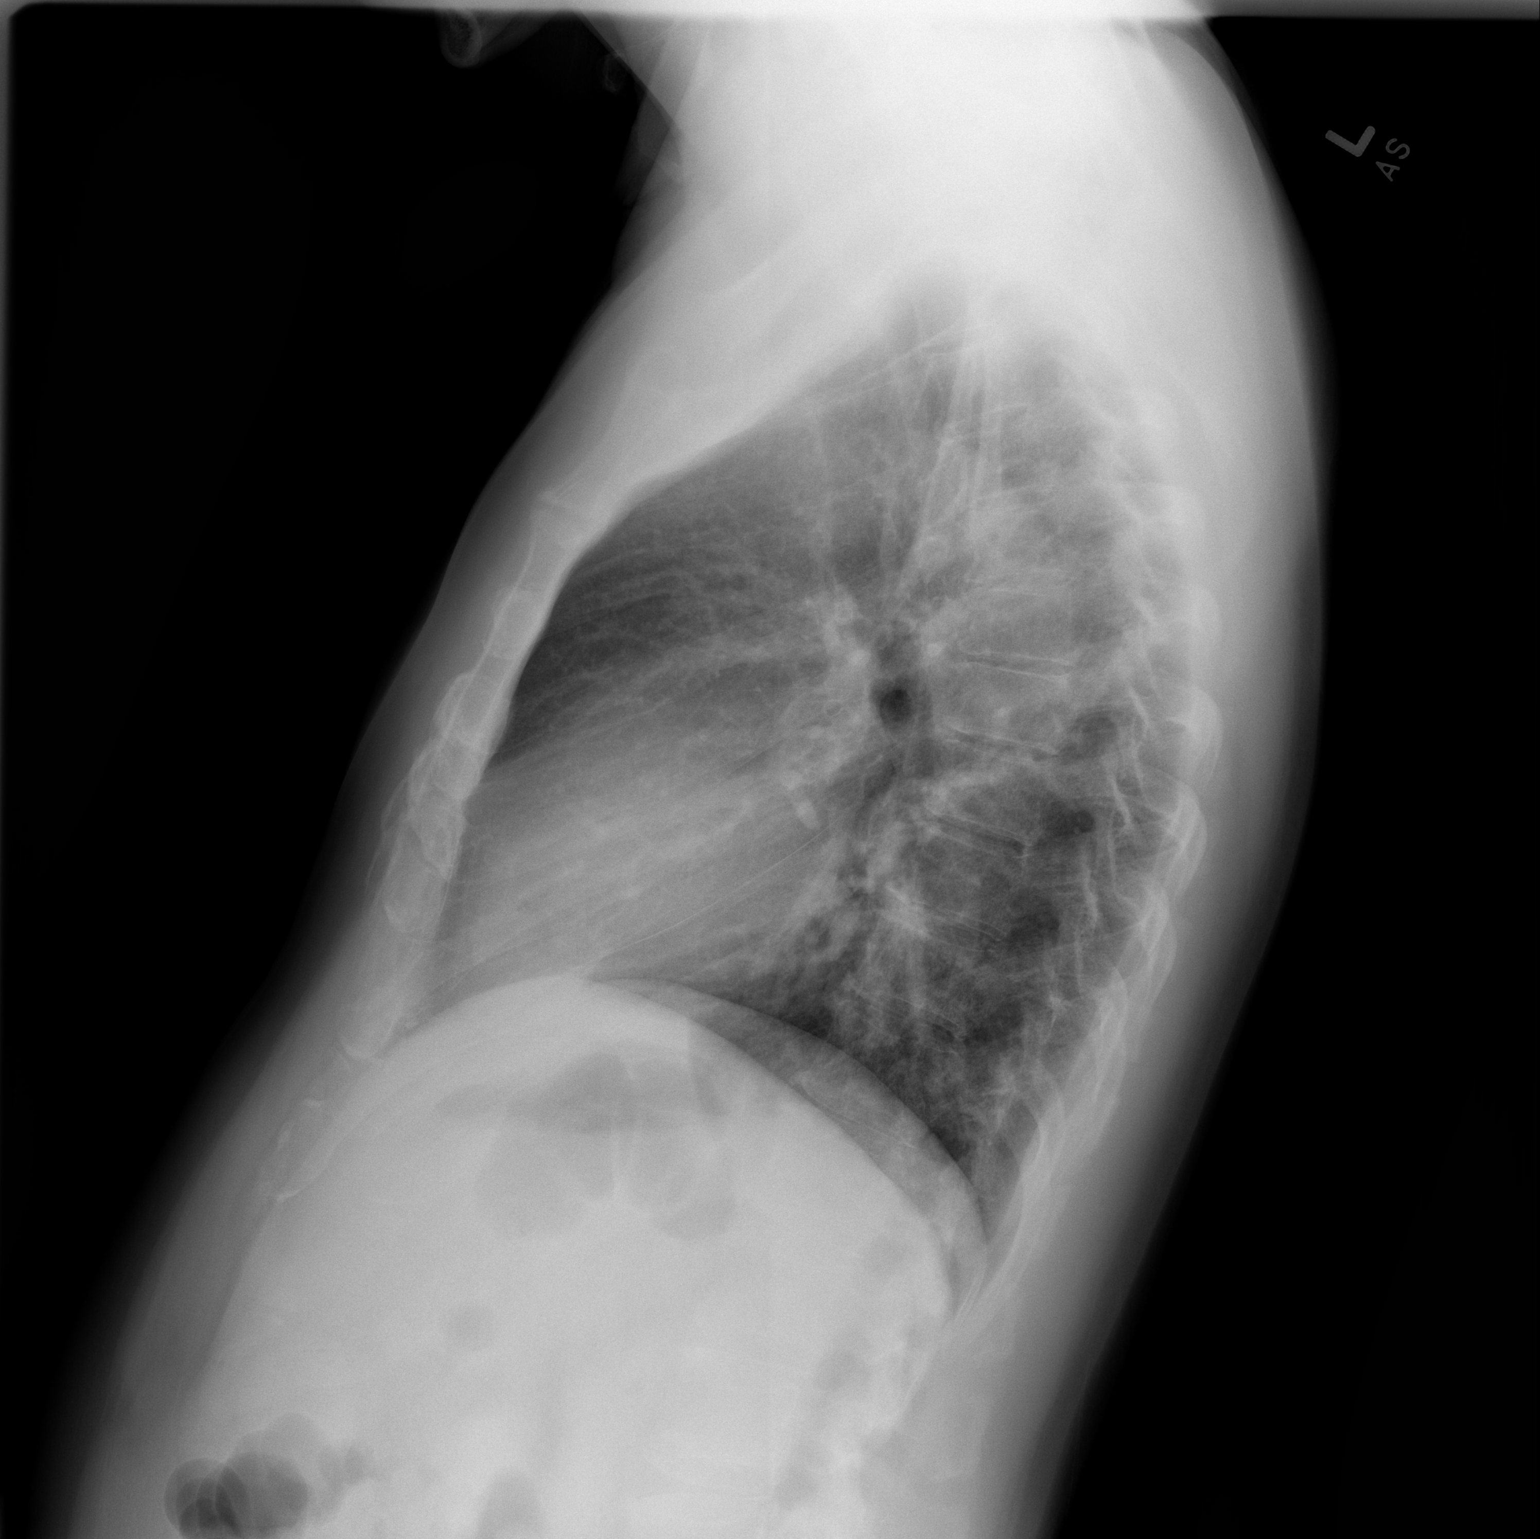

[2 of 2 positions shown; findings below may reference images not displayed]

FINDINGS: The heart size and mediastinal contours are normal. The lungs are
clear. There is no pleural effusion or pneumothorax. No acute
osseous findings are identified. Posttraumatic deformity of the
distal left clavicle appears unchanged.
IMPRESSION: No active cardiopulmonary process.

## 2021-04-26 ENCOUNTER — Other Ambulatory Visit: Payer: Medicaid Other

## 2021-04-26 ENCOUNTER — Other Ambulatory Visit: Payer: Self-pay

## 2021-04-26 DIAGNOSIS — R1013 Epigastric pain: Secondary | ICD-10-CM | POA: Diagnosis not present

## 2021-04-27 LAB — H. PYLORI BREATH TEST: H pylori Breath Test: NEGATIVE

## 2021-06-18 ENCOUNTER — Ambulatory Visit: Payer: Medicaid Other | Admitting: Family Medicine

## 2021-06-18 ENCOUNTER — Other Ambulatory Visit: Payer: Self-pay

## 2021-06-18 ENCOUNTER — Encounter: Payer: Self-pay | Admitting: Family Medicine

## 2021-06-18 VITALS — BP 126/80 | HR 77 | Ht 70.0 in | Wt 175.4 lb

## 2021-06-18 DIAGNOSIS — M791 Myalgia, unspecified site: Secondary | ICD-10-CM | POA: Insufficient documentation

## 2021-06-18 NOTE — Assessment & Plan Note (Addendum)
Patient with muscle achiness in his shoulders as well as bilateral lower extremities.  Physical exam is overall reassuring for no impingement syndromes.  Neck/shoulder tightness appears more related to muscular causes.  Recommend the patient use heating pads for the area. Bilateral lower extremity calf tenderness, is not limiting patient in activity which is reassuring.  Differential includes: restless leg syndrome, hypomagnesia, hypocalcemia, low vitamin D, myositis. - We will check mag level, BMP, vitamin D, CK level - Follow-up in 2 to 4 weeks if no improvement or worsening

## 2021-06-18 NOTE — Progress Notes (Signed)
    SUBJECTIVE:   CHIEF COMPLAINT / HPI:   Leg cramps at night Patient reports that he has been having a burning sensation in his feet and some leg cramps at night for the last 2 weeks.  States that massage helps.  He also states that he has "bone pain in his shins when the [facility] comes on at night". The symptoms combined with the muscle tightness in his neck are having and concern for having vitamin deficiencies including magnesium and vitamin D and calcium. He denies any pain on walking and states that his legs never give out on him.   PERTINENT  PMH / PSH: Reviewed  OBJECTIVE:   BP 126/80   Pulse 77   Ht 5\' 10"  (1.778 m)   Wt 175 lb 6.4 oz (79.6 kg)   SpO2 96%   BMI 25.17 kg/m   General: NAD, well-appearing, well-nourished Respiratory: No respiratory distress, breathing comfortably, able to speak in full sentences Skin: warm and dry, no rashes noted on exposed skin Psych: Appropriate affect and mood MSK:  Bilateral lower extremities: Tenderness in calves with palpation.  Full ROM, no pain with ROM, no deformity noted on examination and no peripheral edema. Neck/shoulder: Minimal pain with rotation of neck on bilateral sides, more related to muscle stretching.  Trapezius muscle tender bilaterally, rhomboid muscles tender to palpation.  ASSESSMENT/PLAN:   Generalized muscle ache Patient with muscle achiness in his shoulders as well as bilateral lower extremities.  Physical exam is overall reassuring for no impingement syndromes.  Neck/shoulder tightness appears more related to muscular causes.  Recommend the patient use heating pads for the area. Bilateral lower extremity calf tenderness, is not limiting patient in activity which is reassuring.  Differential includes: restless leg syndrome, hypomagnesia, hypocalcemia, low vitamin D, myositis. - We will check mag level, BMP, vitamin D, CK level - Follow-up in 2 to 4 weeks if no improvement or worsening     Kyair Ditommaso, DO [facility]

## 2021-06-18 NOTE — Patient Instructions (Signed)
We are getting labs today to check your vitamin levels as well as kidney function and calcium.  It could be restless leg syndrome that is contributing to this.  If there are any abnormalities we will call you with results, otherwise you will get a letter in the mail everything is normal.    Please follow-up in the next 2-4 weeks if is not improving

## 2021-06-19 LAB — BASIC METABOLIC PANEL
BUN/Creatinine Ratio: 11 (ref 9–20)
BUN: 11 mg/dL (ref 6–24)
CO2: 27 mmol/L (ref 20–29)
Calcium: 8.8 mg/dL (ref 8.7–10.2)
Chloride: 101 mmol/L (ref 96–106)
Creatinine, Ser: 0.97 mg/dL (ref 0.76–1.27)
Glucose: 97 mg/dL (ref 65–99)
Potassium: 4 mmol/L (ref 3.5–5.2)
Sodium: 138 mmol/L (ref 134–144)
eGFR: 91 mL/min/{1.73_m2} (ref >59)

## 2021-06-19 LAB — VITAMIN D 25 HYDROXY (VIT D DEFICIENCY, FRACTURES): Vit D, 25-Hydroxy: 18.3 ng/mL — ABNORMAL LOW (ref 30.0–100.0)

## 2021-06-19 LAB — CK: Total CK: 261 U/L (ref 41–331)

## 2021-06-19 LAB — MAGNESIUM: Magnesium: 2.4 mg/dL — ABNORMAL HIGH (ref 1.6–2.3)

## 2021-06-19 LAB — TSH: TSH: 2.06 u[IU]/mL (ref 0.450–4.500)

## 2021-06-21 ENCOUNTER — Telehealth: Payer: Self-pay | Admitting: Family Medicine

## 2021-06-21 MED ORDER — VITAMIN D (ERGOCALCIFEROL) 1.25 MG (50000 UNIT) PO CAPS: 50000.0000 [IU] | ORAL_CAPSULE | ORAL | 0 refills | Status: DC

## 2021-06-21 NOTE — Telephone Encounter (Signed)
Called patient to discuss results, which overall are normal aside from Vitamin D level. Discussed with Dr. McDiarmid and will given patient Vit D 50000U weekly x8 weeks then followed by 1000U daily.   Marah Park, DO

## 2021-07-31 ENCOUNTER — Encounter: Payer: Self-pay | Admitting: Student

## 2021-07-31 ENCOUNTER — Other Ambulatory Visit: Payer: Self-pay

## 2021-07-31 ENCOUNTER — Ambulatory Visit: Payer: Medicaid Other | Admitting: Student

## 2021-07-31 DIAGNOSIS — M7661 Achilles tendinitis, right leg: Secondary | ICD-10-CM | POA: Insufficient documentation

## 2021-07-31 NOTE — Progress Notes (Signed)
    SUBJECTIVE:   CHIEF COMPLAINT / HPI: Really bad pain in back of leg, meds not working  Has ankle/ achilies tendon pain for two weeks. No trauma or event that started. Patient has been walking regularly and complains that the pain is noticeable. It hurts when he prays, because he gets on his knees and extend's his ankle, and when he walks. Appears to be aggravated whenever he flexes or extends his ankle/achilles. NSAID's don't help with pain, but he has tried ibuprofen and vix vapor rubs with no relief. It seem's to hurt more with extension than with flexion of the ankle. Pain is rated as 6-7/10.   Patient has no redness or swelling of lower extremity, both lower extremities are symmetric. Pain is located near tendon, and not up the calf or popliteal fossa.    PERTINENT  PMH / PSH: HTN, HLD, generalized muscle ache  OBJECTIVE:   BP (!) 148/92   Pulse 88   Ht 5\' 10"  (1.778 m)   Wt 80.7 kg   SpO2 98%   BMI 25.54 kg/m    Physical Exam Constitutional:      General: He is not in acute distress.    Appearance: Normal appearance.  Cardiovascular:     Rate and Rhythm: Normal rate and regular rhythm.     Heart sounds: No murmur heard.   No friction rub. No gallop.  Pulmonary:     Effort: Pulmonary effort is normal.     Breath sounds: Normal breath sounds. No stridor. No wheezing.  Musculoskeletal:        General: No swelling or deformity. Normal range of motion.     Right lower leg: No edema.     Comments: Patients lower extremities are equal and symmetric. He has no swelling, or redness, and minimal tenderness on palpation. Pain is located near achilles tendon, not on the calf. It is most tender with active range of motion.  Neurological:     Mental Status: He is alert.     ASSESSMENT/PLAN:   Achilles tendinitis, right leg Patient pain and presentation more concerning for Achilles Tendonitis, than DVT. Leg lacks any redness or swelling and located lower (distal portion) of  extremity, around the achilles tendon. Given the the history of two weeks of pain, and less acute symptom onset, this issue is likely coming from a MSK concern.  -Patient encouraged to make appointment with Sports Medicine [phone]) -Encouraged patient to follow up in 1 month if no improvement -Materials given about achilles tendonitis and at home therapies.     ([phone], MD [facility]

## 2021-07-31 NOTE — Patient Instructions (Signed)
It was great to see you! Thank you for allowing me to participate in your care!   Our plans for today:  - Achilles tendonitis  -Schedule an appointment with Sports Medicine [phone] - Follow up in 1 month if not improved  Take care and seek immediate care sooner if you develop any concerns.   Dr. Bess Kinds, MD [facility]

## 2021-07-31 NOTE — Assessment & Plan Note (Addendum)
Patient pain and presentation more concerning for Achilles Tendonitis, than DVT. Leg lacks any redness or swelling and located lower (distal portion) of extremity, around the achilles tendon. Given the the history of two weeks of pain, and less acute symptom onset, this issue is likely coming from a MSK concern.  -Patient encouraged to make appointment with Sports Medicine [phone]) -Encouraged patient to follow up in 1 month if no improvement -Materials given about achilles tendonitis and at home therapies.

## 2021-08-29 ENCOUNTER — Ambulatory Visit: Payer: Medicaid Other | Admitting: Family Medicine

## 2021-08-29 VITALS — Ht 70.0 in | Wt 178.0 lb

## 2021-08-29 DIAGNOSIS — M7661 Achilles tendinitis, right leg: Secondary | ICD-10-CM

## 2021-08-29 MED ORDER — MELOXICAM 15 MG PO TABS: 15.0000 mg | ORAL_TABLET | Freq: Every day | ORAL | 1 refills | Status: DC

## 2021-08-29 NOTE — Progress Notes (Signed)
   Subjective:    Patient ID: Michael Fowler, male    DOB: 1966/10/10, 55 y.o.   MRN: [id number]  HPI Patient presents for evaluation of right foot pain.  He has had pain in his right posterior foot for about 6 weeks.  He was evaluated at PCP office 1 month ago, felt to have Achilles tendinitis.  He states he has not had any known injuries.  Pain still persists despite using ibuprofen.  He does aerobic exercise 3-5 times a week either walking or exercise bike.  Pain is worse with certain positions during prayer (full plantarflexion).  Review of Systems     Objective:   Physical Exam Alert, NAD  MSK: No obvious deformity, erythema, swelling.  There is mild pain with tenderness along the right Achilles.  No calf tenderness.  Full ROM with plantarflexion and dorsiflexion.  There is pain elicited with passive plantarflexion.  Full strength with plantarflexion and dorsiflexion.  Achilles reflexes symmetric.  2+ PT and DP pulses bilaterally.  Negative Thompson test.     Assessment & Plan:   Achilles tendinopathy, right  No significant improvement with NSAIDs.  His prayer position may be contributing.  He was given heel lifts and home exercises.  Prescription given for meloxicam to be taken for 1 week, then as needed.  Follow-up in 5 to 6 weeks.  Could consider PT, orthotics, and/or nitro patches if not improving.  Littie Deeds, MD PGY-2

## 2021-08-29 NOTE — Patient Instructions (Addendum)
You have Achilles Tendinopathy Meloxicam 15mg  daily with food for pain and inflammation - stop the ibuprofen Do home exercises as directed. Icing 15 minutes at a time as needed. Avoid uneven ground, hills as much as possible. Heel lifts in shoes or shoes with a natural heel lift. Consider physical therapy, orthotics, nitro patches if not improving as expected. Follow up in 5-6 weeks.

## 2021-10-01 ENCOUNTER — Encounter: Payer: Self-pay | Admitting: Family Medicine

## 2021-10-01 ENCOUNTER — Ambulatory Visit (INDEPENDENT_AMBULATORY_CARE_PROVIDER_SITE_OTHER): Payer: Medicaid Other | Admitting: Family Medicine

## 2021-10-01 ENCOUNTER — Other Ambulatory Visit: Payer: Self-pay

## 2021-10-01 VITALS — BP 139/90 | HR 80 | Ht 70.0 in | Wt 178.8 lb

## 2021-10-01 DIAGNOSIS — E559 Vitamin D deficiency, unspecified: Secondary | ICD-10-CM

## 2021-10-01 DIAGNOSIS — Z0000 Encounter for general adult medical examination without abnormal findings: Secondary | ICD-10-CM

## 2021-10-01 DIAGNOSIS — I10 Essential (primary) hypertension: Secondary | ICD-10-CM

## 2021-10-01 DIAGNOSIS — E7800 Pure hypercholesterolemia, unspecified: Secondary | ICD-10-CM | POA: Diagnosis not present

## 2021-10-01 MED ORDER — AMLODIPINE BESYLATE 2.5 MG PO TABS: 2.5000 mg | ORAL_TABLET | Freq: Every day | ORAL | 1 refills | Status: DC

## 2021-10-01 NOTE — Assessment & Plan Note (Signed)
Patient has not been taking his statin medication as he was unaware he was supposed to be.  10-year CVD risk is 12.3%.  Rechecking lipid panel tomorrow, patient will likely need to restart rosuvastatin.

## 2021-10-01 NOTE — Progress Notes (Signed)
Subjective:  CC -- Annual Physical  Pt reports he is overall doing well and would like to have his labs checked tomorrow.  He does note that he has been concerned recently in regards to his ejaculations, he reports that he does not have a full ejaculation and that only a little bit comes out and then he has to go use the restroom to relieve himself of the rest of it.  He is currently on Flomax for BPH and he states that this problem has been going on for quite some time but he wasn't sure if this was normal.   Cardiovascular: - The 10-year ASCVD risk score (Arnett DK, et al., 2019) is: 12.3%   Values used to calculate the score:     Age: 55 years     Sex: Male     Is Non-Hispanic African American: No     Diabetic: No     Tobacco smoker: No     Systolic Blood Pressure: 139 mmHg     Is BP treated: Yes     HDL Cholesterol: 42 mg/dL     Total Cholesterol: 237 mg/dL - Dx Hypertension: yes  - Dx Hyperlipidemia: yes, not compliant with statin (unaware he was supposed to be on it) - Dx Obesity: BMI 25.66 - Physical Activity: yes, walks 15-30 minutes and bikes  - Diabetes: no  Cancer: Colorectal >> Colonoscopy: no, patient declines after discussion of pros and cons, patient does not have many risk factors.   Lung >> Tobacco Use: no  Prostate >> Patient has diagnosed BPH on Flomax Skin >> Suspicious lesions: no   Social: Alcohol Use: no  Tobacco Use: no  Other Drugs: no  Risky Sexual Behavior: no  Depression: no   - PHQ9 score:  Support and Life at Home: yes   Other: Osteoporosis: low concern  Zoster Vaccine: no, patient declines  Flu Vaccine: no, patient declines  Pneumonia Vaccine: no, patient declines  ROS- positive for concerns regarding ejaculation.  Past Medical History Patient Active Problem List   Diagnosis Date Noted   Achilles tendinitis, right leg 07/31/2021   Generalized muscle ache 06/18/2021   Dyspepsia 04/10/2021   Persistent cough 11/05/2020    Costochondritis 11/05/2020   Subacute rhinosinusitis 05/25/2019   Seasonal allergies 11/02/2018   Chest pain 03/05/2017   Headache 03/02/2015   Glaucoma 05/15/2014   Hypertension 01/24/2014   Central retinal vein occlusion of left eye 04/08/2012   Allergic rhinitis 11/02/2011   Gastroesophageal reflux disease without esophagitis 04/29/2011   HYPERCHOLESTEROLEMIA 08/08/2010   Dizziness 08/08/2010   Constipation 04/06/2008   Enlarged prostate 04/06/2008    Medications- reviewed and updated Current Outpatient Medications  Medication Sig Dispense Refill   acetaminophen (TYLENOL) 500 MG tablet Take 500 mg by mouth every 6 (six) hours as needed for headache.     amLODipine (NORVASC) 2.5 MG tablet Take 1 tablet (2.5 mg total) by mouth at bedtime. 30 tablet 1   ibuprofen (ADVIL) 200 MG tablet Take 200 mg by mouth every 6 (six) hours as needed for headache.     latanoprost (XALATAN) 0.005 % ophthalmic solution Place 1 drop into both eyes at bedtime. 2.5 mL 1   omeprazole (PRILOSEC OTC) 20 MG tablet Take 1 tablet (20 mg total) by mouth daily. 30 tablet 3   RESTASIS 0.05 % ophthalmic emulsion Place 1 drop into both eyes daily.     tamsulosin (FLOMAX) 0.4 MG CAPS capsule Take 1 capsule (0.4 mg total) by mouth daily.  90 capsule 3   cetirizine (ZYRTEC) 10 MG tablet Take 1 tablet (10 mg total) by mouth daily. (Patient not taking: Reported on 10/01/2021) 30 tablet 11   famotidine (PEPCID) 20 MG tablet Take 1 tablet (20 mg total) by mouth 2 (two) times daily. (Patient not taking: Reported on 10/01/2021) 60 tablet 1   fluticasone (FLONASE) 50 MCG/ACT nasal spray Place 2 sprays into both nostrils daily. (Patient not taking: Reported on 10/01/2021) 16 g 1   guaiFENesin (ROBITUSSIN) 100 MG/5ML SOLN Take 5 mLs (100 mg total) by mouth every 4 (four) hours as needed for cough or to loosen phlegm. (Patient not taking: Reported on 10/01/2021) 236 mL 0   meloxicam (MOBIC) 15 MG tablet Take 1 tablet (15 mg total) by  mouth daily. (Patient not taking: Reported on 10/01/2021) 30 tablet 1   polyethylene glycol (MIRALAX / GLYCOLAX) 17 g packet Take 17 g by mouth daily. (Patient not taking: Reported on 10/01/2021) 14 each 0   rosuvastatin (CRESTOR) 10 MG tablet Take 1 tablet (10 mg total) by mouth daily. (Patient not taking: Reported on 10/01/2021) 90 tablet 3   sodium chloride (OCEAN) 0.65 % SOLN nasal spray Place 1 spray into both nostrils as needed for congestion. (Patient not taking: Reported on 10/01/2021) 1 Bottle 0   Vitamin D, Ergocalciferol, (DRISDOL) 1.25 MG (50000 UNIT) CAPS capsule Take 1 capsule (50,000 Units total) by mouth every 7 (seven) days. (Patient not taking: Reported on 10/01/2021) 8 capsule 0   No current facility-administered medications for this visit.    Objective: BP 139/90   Pulse 80   Ht 5\' 10"  (1.778 m)   Wt 178 lb 12.8 oz (81.1 kg)   SpO2 100%   BMI 25.66 kg/m  Gen: NAD, alert, cooperative with exam HEENT: NCAT, EOMI, PERRL CV: RRR, good S1/S2, no murmur Resp: CTABL, no wheezes, non-labored Abd: Soft, Non Tender, Non Distended, BS present, no guarding or organomegaly Genital Exam: not done Ext: No edema, warm Neuro: Alert and oriented, No gross deficits   Assessment/Plan:  Hypertension BP remains mildly elevated at 139/90, this has been persistent for the last several readings.  Patient agreeable to starting low-dose blood pressure medication.  Starting amlodipine 2.5 mg, patient to check his blood pressure at home and follow-up in the next 3 to 4 weeks.  BMP checked at last visit was within normal limits.  Discussed/educated patient on hypotensive symptoms.  HYPERCHOLESTEROLEMIA Patient has not been taking his statin medication as he was unaware he was supposed to be.  10-year CVD risk is 12.3%.  Rechecking lipid panel tomorrow, patient will likely need to restart rosuvastatin.   Orders Placed This Encounter  Procedures   Lipid panel    Standing Status:   Future     Standing Expiration Date:   10/01/2022   Vitamin D, 25-hydroxy    Standing Status:   Future    Standing Expiration Date:   10/01/2022    Meds ordered this encounter  Medications   amLODipine (NORVASC) 2.5 MG tablet    Sig: Take 1 tablet (2.5 mg total) by mouth at bedtime.    Dispense:  30 tablet    Refill:  1    Abnormal ejaculation pattern Patient is currently on Flomax and is having issues with full ejaculation, though he notes he does not have any issues with erection.  Discussed with patient that we will further evaluate this at his next visit.  Consideration that this could be related to his Flomax and  may be a retrograde ejaculation, will discuss at next visit.   Evelena Leyden, DO, PGY-2 10/01/2021 5:06 PM

## 2021-10-01 NOTE — Patient Instructions (Addendum)
I am starting you on a low-dose blood pressure medication called amlodipine and I want you to take your blood pressures at home.  I want you to follow-up in the next 3 to 4 weeks with me and we will discuss this and your other concern.  Health Maintenance, Male Adopting a healthy lifestyle and getting preventive care are important in promoting health and wellness. Ask your health care provider about: The right schedule for you to have regular tests and exams. Things you can do on your own to prevent diseases and keep yourself healthy. What should I know about diet, weight, and exercise? Eat a healthy diet  Eat a diet that includes plenty of vegetables, fruits, low-fat dairy products, and lean protein. Do not eat a lot of foods that are high in solid fats, added sugars, or sodium. Maintain a healthy weight Body mass index (BMI) is a measurement that can be used to identify possible weight problems. It estimates body fat based on height and weight. Your health care provider can help determine your BMI and help you achieve or maintain a healthy weight. Get regular exercise Get regular exercise. This is one of the most important things you can do for your health. Most adults should: Exercise for at least 150 minutes each week. The exercise should increase your heart rate and make you sweat (moderate-intensity exercise). Do strengthening exercises at least twice a week. This is in addition to the moderate-intensity exercise. Spend less time sitting. Even light physical activity can be beneficial. Watch cholesterol and blood lipids Have your blood tested for lipids and cholesterol at 57 years of age, then have this test every 5 years. You may need to have your cholesterol levels checked more often if: Your lipid or cholesterol levels are high. You are older than 57 years of age. You are at high risk for heart disease. What should I know about cancer screening? Many types of cancers can be detected  early and may often be prevented. Depending on your health history and family history, you may need to have cancer screening at various ages. This may include screening for: Colorectal cancer. Prostate cancer. Skin cancer. Lung cancer. What should I know about heart disease, diabetes, and high blood pressure? Blood pressure and heart disease High blood pressure causes heart disease and increases the risk of stroke. This is more likely to develop in people who have high blood pressure readings, are of African descent, or are overweight. Talk with your health care provider about your target blood pressure readings. Have your blood pressure checked: Every 3-5 years if you are 1-76 years of age. Every year if you are 36 years old or older. If you are between the ages of 27 and 13 and are a current or former smoker, ask your health care provider if you should have a one-time screening for abdominal aortic aneurysm (AAA). Diabetes Have regular diabetes screenings. This checks your fasting blood sugar level. Have the screening done: Once every three years after age 54 if you are at a normal weight and have a low risk for diabetes. More often and at a younger age if you are overweight or have a high risk for diabetes. What should I know about preventing infection? Hepatitis B If you have a higher risk for hepatitis B, you should be screened for this virus. Talk with your health care provider to find out if you are at risk for hepatitis B infection. Hepatitis C Blood testing is recommended for: Everyone  born from [birth year] through 1967. Anyone with known risk factors for hepatitis C. Sexually transmitted infections (STIs) You should be screened each year for STIs, including gonorrhea and chlamydia, if: You are sexually active and are younger than 57 years of age. You are older than 57 years of age and your health care provider tells you that you are at risk for this type of infection. Your sexual  activity has changed since you were last screened, and you are at increased risk for chlamydia or gonorrhea. Ask your health care provider if you are at risk. Ask your health care provider about whether you are at high risk for HIV. Your health care provider may recommend a prescription medicine to help prevent HIV infection. If you choose to take medicine to prevent HIV, you should first get tested for HIV. You should then be tested every 3 months for as long as you are taking the medicine. Follow these instructions at home: Lifestyle Do not use any products that contain nicotine or tobacco, such as cigarettes, e-cigarettes, and chewing tobacco. If you need help quitting, ask your health care provider. Do not use street drugs. Do not share needles. Ask your health care provider for help if you need support or information about quitting drugs. Alcohol use Do not drink alcohol if your health care provider tells you not to drink. If you drink alcohol: Limit how much you have to 0-2 drinks a day. Be aware of how much alcohol is in your drink. In the [place] one drink equals one 12 oz bottle of beer (355 mL), one 5 oz glass of wine (148 mL), or one 1 oz glass of hard liquor (44 mL). General instructions Schedule regular health, dental, and eye exams. Stay current with your vaccines. Tell your health care provider if: You often feel depressed. You have ever been abused or do not feel safe at home. Summary Adopting a healthy lifestyle and getting preventive care are important in promoting health and wellness. Follow your health care provider's instructions about healthy diet, exercising, and getting tested or screened for diseases. Follow your health care provider's instructions on monitoring your cholesterol and blood pressure. This information is not intended to replace advice given to you by your health care provider. Make sure you discuss any questions you have with your health care  provider. Document Revised: 01/25/2021 Document Reviewed: 11/10/2018 Elsevier Patient Education  2022 [employer].

## 2021-10-01 NOTE — Assessment & Plan Note (Signed)
BP remains mildly elevated at 139/90, this has been persistent for the last several readings.  Patient agreeable to starting low-dose blood pressure medication.  Starting amlodipine 2.5 mg, patient to check his blood pressure at home and follow-up in the next 3 to 4 weeks.  BMP checked at last visit was within normal limits.  Discussed/educated patient on hypotensive symptoms.

## 2021-10-02 ENCOUNTER — Other Ambulatory Visit: Payer: Medicaid Other

## 2021-10-02 ENCOUNTER — Ambulatory Visit: Payer: Medicaid Other | Admitting: Family Medicine

## 2021-10-02 DIAGNOSIS — E7800 Pure hypercholesterolemia, unspecified: Secondary | ICD-10-CM | POA: Diagnosis not present

## 2021-10-02 DIAGNOSIS — E559 Vitamin D deficiency, unspecified: Secondary | ICD-10-CM | POA: Diagnosis not present

## 2021-10-03 LAB — LIPID PANEL
Chol/HDL Ratio: 6 ratio — ABNORMAL HIGH (ref 0.0–5.0)
Cholesterol, Total: 246 mg/dL — ABNORMAL HIGH (ref 100–199)
HDL: 41 mg/dL (ref >39)
LDL Chol Calc (NIH): 167 mg/dL — ABNORMAL HIGH (ref 0–99)
Triglycerides: 206 mg/dL — ABNORMAL HIGH (ref 0–149)
VLDL Cholesterol Cal: 38 mg/dL (ref 5–40)

## 2021-10-03 LAB — VITAMIN D 25 HYDROXY (VIT D DEFICIENCY, FRACTURES): Vit D, 25-Hydroxy: 20.1 ng/mL — ABNORMAL LOW (ref 30.0–100.0)

## 2021-10-04 ENCOUNTER — Other Ambulatory Visit: Payer: Self-pay | Admitting: Family Medicine

## 2021-10-04 MED ORDER — VITAMIN D (ERGOCALCIFEROL) 1.25 MG (50000 UNIT) PO CAPS
50000.0000 [IU] | ORAL_CAPSULE | ORAL | 0 refills | Status: DC
Start: 2021-10-04 — End: 2022-09-25

## 2021-10-04 MED ORDER — ROSUVASTATIN CALCIUM 10 MG PO TABS
10.0000 mg | ORAL_TABLET | Freq: Every day | ORAL | 3 refills | Status: DC
Start: 2021-10-04 — End: 2021-12-19

## 2021-10-14 ENCOUNTER — Other Ambulatory Visit: Payer: Self-pay

## 2021-10-14 DIAGNOSIS — K219 Gastro-esophageal reflux disease without esophagitis: Secondary | ICD-10-CM

## 2021-10-16 MED ORDER — OMEPRAZOLE MAGNESIUM 20 MG PO TBEC: 20.0000 mg | DELAYED_RELEASE_TABLET | Freq: Every day | ORAL | 3 refills | Status: DC

## 2021-10-22 ENCOUNTER — Ambulatory Visit (INDEPENDENT_AMBULATORY_CARE_PROVIDER_SITE_OTHER): Payer: Medicaid Other | Admitting: Family Medicine

## 2021-10-22 ENCOUNTER — Encounter: Payer: Self-pay | Admitting: Family Medicine

## 2021-10-22 ENCOUNTER — Other Ambulatory Visit: Payer: Self-pay

## 2021-10-22 VITALS — BP 150/96 | HR 80 | Ht 70.0 in | Wt 178.2 lb

## 2021-10-22 DIAGNOSIS — I10 Essential (primary) hypertension: Secondary | ICD-10-CM | POA: Diagnosis not present

## 2021-10-22 DIAGNOSIS — N5314 Retrograde ejaculation: Secondary | ICD-10-CM | POA: Diagnosis not present

## 2021-10-22 DIAGNOSIS — N400 Enlarged prostate without lower urinary tract symptoms: Secondary | ICD-10-CM

## 2021-10-22 NOTE — Progress Notes (Signed)
    SUBJECTIVE:   CHIEF COMPLAINT / HPI:   Patient presents for hypertension follow-up.  He states that he has been he had blood pressures at home and the measurements have typically been in the 120s-130s/60-70s.  He is taking his amlodipine without any issues.  Patient does note that since he has been taking the rosuvastatin he has had increased bloating in his abdomen that has been painful.  He does note he stopped taking medication about 3 days ago.  He did start taking omeprazole with that and had some improvement in his symptoms.  Patient notes that he is still little bit concerned about his issues with intercourse and having abnormal ejaculations.  He has a history of BPH and is currently on Flomax  PERTINENT  PMH / PSH: reviewed  OBJECTIVE:   BP (!) 150/96   Pulse 80   Ht 5\' 10"  (1.778 m)   Wt 178 lb 4 oz (80.9 kg)   SpO2 100%   BMI 25.58 kg/m   General: NAD, well-appearing, well-nourished Respiratory: No respiratory distress, breathing comfortably, able to speak in full sentences Skin: warm and dry, no rashes noted on exposed skin Psych: Appropriate affect and mood  ASSESSMENT/PLAN:   Hypertension BP elevated in office to 150/96, repeat was 144/86.  Patient reports home blood pressures have been in the 120s-130s.  He has been tolerating the medication well.  Discussed with patient to continue logging his blood pressures and taking measurements in the morning and afternoon.  Patient to follow-up in 2 months if no issues; discussed if blood pressures consistently elevated above 130 systolic that we will increase medication prior to follow-up.  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  Currently he is concerned about possible retrograde ejaculation and is going to trial a period of being off of the medication to see if that helps.  Counseled patient to continue Flomax if BPH symptoms recur.     , DO [facility]

## 2021-10-22 NOTE — Assessment & Plan Note (Signed)
Patient is currently on Flomax with good symptom control.  Currently he is concerned about possible retrograde ejaculation and is going to trial a period of being off of the medication to see if that helps.  Counseled patient to continue Flomax if BPH symptoms recur.

## 2021-10-22 NOTE — Patient Instructions (Addendum)
I want you to take your blood pressure once in the morning and once in the afternoon, make sure that you are in a cool quiet area and have been sitting for about 5 minutes before you take your blood pressure.  Wanted to keep a log of these pressures and bring them back to your follow-up in the next 2 months.  Your goal is to be around 120/80, if your blood pressures are consistently > 130/90 then please let me know and we will increase your medication.  I want you to continue taking the rosuvastatin medication for your cholesterol, taking omeprazole with that if you feel that it helps.  I hope the symptoms go away in the next several weeks.

## 2021-10-22 NOTE — Assessment & Plan Note (Signed)
BP elevated in office to 150/96, repeat was 144/86.  Patient reports home blood pressures have been in the 120s-130s.  He has been tolerating the medication well.  Discussed with patient to continue logging his blood pressures and taking measurements in the morning and afternoon.  Patient to follow-up in 2 months if no issues; discussed if blood pressures consistently elevated above 130 systolic that we will increase medication prior to follow-up.

## 2021-12-03 ENCOUNTER — Other Ambulatory Visit: Payer: Self-pay | Admitting: Family Medicine

## 2021-12-05 ENCOUNTER — Other Ambulatory Visit: Payer: Self-pay

## 2021-12-05 MED ORDER — TAMSULOSIN HCL 0.4 MG PO CAPS: 0.4000 mg | ORAL_CAPSULE | Freq: Every day | ORAL | 3 refills | Status: DC

## 2021-12-19 ENCOUNTER — Ambulatory Visit: Payer: Medicaid Other | Admitting: Family Medicine

## 2021-12-19 ENCOUNTER — Encounter: Payer: Self-pay | Admitting: Family Medicine

## 2021-12-19 ENCOUNTER — Other Ambulatory Visit: Payer: Self-pay

## 2021-12-19 VITALS — BP 130/80 | HR 62 | Ht 70.0 in | Wt 177.4 lb

## 2021-12-19 DIAGNOSIS — H409 Unspecified glaucoma: Secondary | ICD-10-CM | POA: Diagnosis not present

## 2021-12-19 DIAGNOSIS — I10 Essential (primary) hypertension: Secondary | ICD-10-CM | POA: Diagnosis not present

## 2021-12-19 DIAGNOSIS — E7800 Pure hypercholesterolemia, unspecified: Secondary | ICD-10-CM | POA: Diagnosis not present

## 2021-12-19 MED ORDER — ROSUVASTATIN CALCIUM 10 MG PO TABS: 10.0000 mg | ORAL_TABLET | Freq: Every day | ORAL | 3 refills | Status: DC

## 2021-12-19 MED ORDER — AMLODIPINE BESYLATE 5 MG PO TABS: 5.0000 mg | ORAL_TABLET | Freq: Every day | ORAL | 3 refills | Status: DC

## 2021-12-19 MED ORDER — LATANOPROST 0.005 % OP SOLN: 1.0000 [drp] | Freq: Every day | OPHTHALMIC | 1 refills | Status: DC

## 2021-12-19 NOTE — Assessment & Plan Note (Signed)
-   Refill rosuvastatin 10 mg daily - Repeat LDL in 3 months

## 2021-12-19 NOTE — Assessment & Plan Note (Signed)
BP elevated at 1.6/91, was 130/80 on repeat.  Patient is close to goal but we will go ahead and increase amlodipine to 5 mg daily.  Patient was instructed to continue to closely monitor his blood pressure, especially given that [place] in the next few [place]. - Increase to amlodipine 5 mg daily - Continue to monitor blood pressure closely

## 2021-12-19 NOTE — Progress Notes (Signed)
° ° °  SUBJECTIVE:   CHIEF COMPLAINT / HPI:   Patient presents for blood pressure follow-up.  He reports that he has been intermittently checking it at home and notes that his systolic blood pressure is typically >130.  He also notes that he decided to resume his Flomax after attempting to pause for a few days to see if that would help with his retrograde ejaculation.  He would rather deal with the side effects and then not use the medication.  PERTINENT  PMH / PSH: Reviewed  OBJECTIVE:   BP 130/80    Pulse 62    Ht 5\' 10"  (1.778 m)    Wt 177 lb 6.4 oz (80.5 kg)    SpO2 100%    BMI 25.45 kg/m   Gen: well-appearing, NAD CV: RRR, no m/r/g appreciated, no peripheral edema Pulm: CTAB, no wheezes/crackles  ASSESSMENT/PLAN:   Hypertension BP elevated at 1.6/91, was 130/80 on repeat.  Patient is close to goal but we will go ahead and increase amlodipine to 5 mg daily.  Patient was instructed to continue to closely monitor his blood pressure, especially given that he plans to be [place] in the next few weeks. - Increase to amlodipine 5 mg daily - Continue to monitor blood pressure closely  HYPERCHOLESTEROLEMIA - Refill rosuvastatin 10 mg daily - Repeat LDL in 3 months     Rylon Poitra, DO [facility]

## 2021-12-19 NOTE — Patient Instructions (Addendum)
It was so great seeing you today! Today we discussed the following:  - I am increasing your Amlodipine to 5mg  daily (instead of the 2.5mg  you were on previously). Please make sure to monitor your blood pressure over the next few weeks. If you are having low blood pressure symptoms (dizziness, weakness, fatigue) then please let me know sooner  - Please continue to take your Rosuvastatin medication, we will be able to re-check in 3 months.    Please make sure to bring any medications you take to your appointments. If you have any questions or concerns please call the office at [phone].

## 2022-04-05 ENCOUNTER — Other Ambulatory Visit: Payer: Self-pay | Admitting: Family Medicine

## 2022-04-05 DIAGNOSIS — K219 Gastro-esophageal reflux disease without esophagitis: Secondary | ICD-10-CM

## 2022-06-09 ENCOUNTER — Other Ambulatory Visit: Payer: Self-pay | Admitting: Family Medicine

## 2022-06-09 DIAGNOSIS — H409 Unspecified glaucoma: Secondary | ICD-10-CM

## 2022-06-23 ENCOUNTER — Ambulatory Visit: Payer: Medicaid Other | Admitting: Family Medicine

## 2022-09-08 ENCOUNTER — Ambulatory Visit: Payer: Medicaid Other | Admitting: Family Medicine

## 2022-09-25 ENCOUNTER — Ambulatory Visit (INDEPENDENT_AMBULATORY_CARE_PROVIDER_SITE_OTHER): Payer: Medicaid Other | Admitting: Family Medicine

## 2022-09-25 ENCOUNTER — Encounter: Payer: Self-pay | Admitting: Family Medicine

## 2022-09-25 VITALS — BP 138/86 | HR 84 | Ht 70.0 in | Wt 174.0 lb

## 2022-09-25 DIAGNOSIS — E7800 Pure hypercholesterolemia, unspecified: Secondary | ICD-10-CM

## 2022-09-25 DIAGNOSIS — J302 Other seasonal allergic rhinitis: Secondary | ICD-10-CM | POA: Diagnosis not present

## 2022-09-25 DIAGNOSIS — I10 Essential (primary) hypertension: Secondary | ICD-10-CM | POA: Diagnosis not present

## 2022-09-25 DIAGNOSIS — E559 Vitamin D deficiency, unspecified: Secondary | ICD-10-CM

## 2022-09-25 MED ORDER — CETIRIZINE HCL 10 MG PO TABS: 10.0000 mg | ORAL_TABLET | Freq: Every day | ORAL | 1 refills | Status: DC

## 2022-09-25 MED ORDER — OLOPATADINE HCL 0.2 % OP SOLN: 1.0000 [drp] | Freq: Every day | OPHTHALMIC | 1 refills | Status: AC | PRN

## 2022-09-25 NOTE — Assessment & Plan Note (Addendum)
BP 138/86, has been well controlled at home.  - Continue amlodipine 5mg  daily - Check BP at home every few days - BMP today

## 2022-09-25 NOTE — Progress Notes (Signed)
    SUBJECTIVE:   CHIEF COMPLAINT / HPI:   Patient was able to go visit his mother in [place] for a few months, which was great. She didn't want him to come leave which did make it hard for him to come home but states that he was glad she was doing well.   BP - has been taking medications as prescribed - BP measurements at home have bee around 110s/70s - No hypertension symptoms  Cholesterol - Would like checked today - Is taking Rosuvastatin 10mg  daily  Vitamin D - Would like to have this rechecked today - Finished his previous course of Vitamin D treatment  PERTINENT  PMH / PSH: Reviewed   OBJECTIVE:   BP 138/86   Pulse 84   Ht 5\' 10"  (1.778 m)   Wt 174 lb (78.9 kg)   SpO2 97%   BMI 24.97 kg/m   Gen: well-appearing, NAD CV: RRR, no m/r/g appreciated, no peripheral edema Pulm: CTAB, no wheezes/crackles GI: soft, non-tender, non-distended  ASSESSMENT/PLAN:   Hypertension BP 138/86, has been well controlled at home.  - Continue amlodipine 5mg  daily - Check BP at home every few days - BMP today  HYPERCHOLESTEROLEMIA - Continue rosuvastatin - lipid panel today  Seasonal allergies - recommend zyrtec and pataday as needed   Vitamin D deficiency - Vitamin D level per patient request  Rise Patience, [place]

## 2022-09-25 NOTE — Patient Instructions (Addendum)
   Here is a vitamin that you can take that is pretty good.   You can take Zyrtec and Pataday eye drops for allergies if they are flaring.   We are going to check your Vitamin D, cholesterol, and kidney function today.

## 2022-09-26 ENCOUNTER — Telehealth: Payer: Self-pay | Admitting: Family Medicine

## 2022-09-26 LAB — LIPID PANEL
Chol/HDL Ratio: 3.2 ratio (ref 0.0–5.0)
Cholesterol, Total: 147 mg/dL (ref 100–199)
HDL: 46 mg/dL (ref >39)
LDL Chol Calc (NIH): 83 mg/dL (ref 0–99)
Triglycerides: 94 mg/dL (ref 0–149)
VLDL Cholesterol Cal: 18 mg/dL (ref 5–40)

## 2022-09-26 LAB — BASIC METABOLIC PANEL
BUN/Creatinine Ratio: 11 (ref 9–20)
BUN: 11 mg/dL (ref 6–24)
CO2: 25 mmol/L (ref 20–29)
Calcium: 8.9 mg/dL (ref 8.7–10.2)
Chloride: 101 mmol/L (ref 96–106)
Creatinine, Ser: 0.96 mg/dL (ref 0.76–1.27)
Glucose: 91 mg/dL (ref 70–99)
Potassium: 4.1 mmol/L (ref 3.5–5.2)
Sodium: 139 mmol/L (ref 134–144)
eGFR: 92 mL/min/{1.73_m2} (ref >59)

## 2022-09-26 LAB — VITAMIN D 25 HYDROXY (VIT D DEFICIENCY, FRACTURES): Vit D, 25-Hydroxy: 20.9 ng/mL — ABNORMAL LOW (ref 30.0–100.0)

## 2022-09-26 MED ORDER — VITAMIN D (ERGOCALCIFEROL) 1.25 MG (50000 UNIT) PO CAPS: 50000.0000 [IU] | ORAL_CAPSULE | ORAL | 0 refills | Status: DC

## 2022-09-26 NOTE — Assessment & Plan Note (Signed)
-   Continue rosuvastatin - lipid panel today

## 2022-09-26 NOTE — Assessment & Plan Note (Signed)
-   recommend zyrtec and pataday as needed

## 2022-09-26 NOTE — Addendum Note (Signed)
Addended by: Francene Castle on: 09/26/2022 08:31 AM   Modules accepted: Level of Service

## 2022-09-26 NOTE — Telephone Encounter (Signed)
Called patient with results. Normal BMP and much improved cholesterol.   Patient's vitamin D remains low and he would like to do another treatment. Weekly vitamin D prescribed, recommend afterwards continuing with regular dose vitamin.

## 2022-12-02 ENCOUNTER — Other Ambulatory Visit: Payer: Self-pay | Admitting: Family Medicine

## 2022-12-06 ENCOUNTER — Other Ambulatory Visit: Payer: Self-pay | Admitting: Family Medicine

## 2022-12-06 DIAGNOSIS — H409 Unspecified glaucoma: Secondary | ICD-10-CM

## 2023-01-05 ENCOUNTER — Other Ambulatory Visit: Payer: Self-pay | Admitting: Family Medicine

## 2023-01-29 ENCOUNTER — Telehealth: Payer: Self-pay | Admitting: *Deleted

## 2023-01-30 ENCOUNTER — Other Ambulatory Visit: Payer: Self-pay | Admitting: Family Medicine

## 2023-01-30 ENCOUNTER — Encounter: Payer: Self-pay | Admitting: Family Medicine

## 2023-01-30 ENCOUNTER — Ambulatory Visit (INDEPENDENT_AMBULATORY_CARE_PROVIDER_SITE_OTHER): Payer: Medicaid Other | Admitting: Family Medicine

## 2023-01-30 VITALS — BP 145/84 | HR 76 | Ht 70.0 in | Wt 175.1 lb

## 2023-01-30 DIAGNOSIS — K219 Gastro-esophageal reflux disease without esophagitis: Secondary | ICD-10-CM

## 2023-01-30 DIAGNOSIS — M79602 Pain in left arm: Secondary | ICD-10-CM | POA: Diagnosis not present

## 2023-01-30 DIAGNOSIS — E559 Vitamin D deficiency, unspecified: Secondary | ICD-10-CM | POA: Diagnosis not present

## 2023-01-30 MED ORDER — CETIRIZINE HCL 10 MG PO TABS: 10.0000 mg | ORAL_TABLET | Freq: Every day | ORAL | 1 refills | Status: DC

## 2023-01-30 MED ORDER — OMEPRAZOLE 20 MG PO CPDR: DELAYED_RELEASE_CAPSULE | ORAL | 0 refills | Status: DC

## 2023-01-30 NOTE — Progress Notes (Signed)
    SUBJECTIVE:   CHIEF COMPLAINT / HPI:   Cough - Wife got sick 2 months ago and now he has been having persistent cough  - Coughing up white mucous - Used Robitussin and Mucinex with mild improvement - will get worse after eating and in the morning - Has history of seasonal allergies and GERD, not taking medications daily   Tingling in his left arm and aching sensation  - Intermittent just in the upper arm (triceps region) - Not associated with any specific movement  - No new exercises or injury  - Has not affected his ability to function  PERTINENT  PMH / PSH: Reviewed  OBJECTIVE:   BP (!) 145/84   Pulse 76   Ht '5\' 10"'$  (1.778 m)   Wt 175 lb 2 oz (79.4 kg)   SpO2 100%   BMI 25.13 kg/m   Gen: well-appearing, NAD CV: RRR, no m/r/g appreciated, no peripheral edema Pulm: CTAB, no wheezes/crackles GI: soft, non-tender, non-distended MSK: Left arm with tenderness to palpation in the right triceps region, no cervical ROM or pain. 5/5 strength of extremity. Negative for Hawkins-Kennedy, lift off test  ASSESSMENT/PLAN:   Left arm pain Patient without red flags for cardiac etiology on examination or history. Given that the pain is in the muscle belly and doesn't seem to have any radicular symptoms, do wonder if this could be related to overuse. Given lack of swelling and with the history, do not think that a DVT would be present, but is something to consider. - Patient given stretching and exercises - Follow-up in 1 to 2 weeks - Strict return precautions if any neck or chest pain - If neck pain, can consider getting neck x-rays  Persistent cough Did have recent viral illness within the last 2 months that could be contributing to persistent cough.  Patient does also have descriptors of acid reflux and allergies and is not currently taking daily medications as prescribed and physical exam is reassuring without consolidation on auscultation.  Do think that those are likely  contributing to these persistence, especially as the weather has been changing and patient's symptoms have worsened with meals. - Omeprazole 20 mg daily refilled - Tums as needed - Zyrtec refilled - If still persistent over the 2 weeks, consider CXR  Vitamin D deficiency - Vitamin D level today per patient request   Michael Fowler, [place]

## 2023-01-30 NOTE — Patient Instructions (Signed)
Thickening multiple things that could be contributing to your cough right now, it can take anywhere from 6 to 8 weeks for a cough to get better after an illness.  The other things that could be contributing are acid reflux (if you are having cough worse in the morning or if you are having cough after eating as well as coughing up some white stuff), allergies can also be contributing.  So what we will do for now is go ahead and try to take your omeprazole and cetirizine every day for the next 1 to 2 weeks.  If the cough is not getting any better then I would recommend coming back and we will consider doing a chest x-ray to make sure everything looks okay.  For the arm pain you are having, right now it seems like it is more muscular in natur, it is hard to tell with it being only every once in a while.  I recommend doing some stretches and some strengthening of your arms over the next 1 to 2 weeks and following back up.  If you start having pain from your chest, neck or pain that goes all the way down your arm into your fingers then come back to the clinic sooner.

## 2023-01-30 NOTE — Telephone Encounter (Signed)
Opend in error. Christen Bame, CMA

## 2023-01-31 LAB — VITAMIN D 25 HYDROXY (VIT D DEFICIENCY, FRACTURES): Vit D, 25-Hydroxy: 24.6 ng/mL — ABNORMAL LOW (ref 30.0–100.0)

## 2023-02-03 ENCOUNTER — Other Ambulatory Visit: Payer: Self-pay | Admitting: Family Medicine

## 2023-02-16 ENCOUNTER — Other Ambulatory Visit: Payer: Self-pay | Admitting: Family Medicine

## 2023-06-29 ENCOUNTER — Encounter: Payer: Self-pay | Admitting: Family Medicine

## 2023-06-29 ENCOUNTER — Ambulatory Visit: Payer: Medicaid Other | Admitting: Family Medicine

## 2023-06-29 VITALS — BP 137/87 | HR 72 | Ht 70.0 in | Wt 178.0 lb

## 2023-06-29 DIAGNOSIS — R208 Other disturbances of skin sensation: Secondary | ICD-10-CM

## 2023-06-29 DIAGNOSIS — J302 Other seasonal allergic rhinitis: Secondary | ICD-10-CM | POA: Diagnosis not present

## 2023-06-29 DIAGNOSIS — G4452 New daily persistent headache (NDPH): Secondary | ICD-10-CM

## 2023-06-29 DIAGNOSIS — Z131 Encounter for screening for diabetes mellitus: Secondary | ICD-10-CM

## 2023-06-29 DIAGNOSIS — N400 Enlarged prostate without lower urinary tract symptoms: Secondary | ICD-10-CM

## 2023-06-29 HISTORY — DX: New daily persistent headache (ndph): G44.52

## 2023-06-29 MED ORDER — TAMSULOSIN HCL 0.4 MG PO CAPS: 0.4000 mg | ORAL_CAPSULE | Freq: Every day | ORAL | 3 refills | Status: DC

## 2023-06-29 MED ORDER — CETIRIZINE HCL 10 MG PO TABS: 10.0000 mg | ORAL_TABLET | Freq: Every day | ORAL | 1 refills | Status: DC | PRN

## 2023-06-29 NOTE — Patient Instructions (Signed)
It was great to see you today! Thank you for choosing [facility] for your primary care.  Today we addressed: 1. New daily persistent headache To make sure nothing concerning is going on, we will have you do a CT scan before you leave on Saturday.  You will get a call tomorrow from [place] about that.  Dehydration could be making this worse.  Please make sure to drink more water, especially in the heat.  2. Burning feet We will do a few different blood tests including B12, B9, thyroid labs, syphilis labs, and autoimmune labs.  Please come back to get those labs tomorrow.  I will call you if anything is not normal.   We are checking some labs today.  You will get a MyChart message or a letter if results are normal. Otherwise, you will get a call from [place].  If you had a referral placed, they will call you to set up an appointment. Please give [place] a call if you don't hear back in the next 2 weeks.  Thank you for coming to see [place] at [facility] and for the opportunity to care for you! Contina Strain, MD 06/29/2023, 5:25 PM

## 2023-06-29 NOTE — Progress Notes (Signed)
SUBJECTIVE:   CHIEF COMPLAINT / HPI:  Michael Fowler is a 57 y.o. male with a pertinent past medical history of HTN, allergies, and glaucoma presenting to the clinic for   Headache Onset: First noticed 2 months ago, when [facility] broke and he could not sleep well.  Never occurred before. Location: Occipital region pain, frontal sinus region pressure. Chronicity: Comes and goes, does occur daily.  Gets better when he lies down and relaxes. Does have some photophobia and phonophobia. No vision effects, no aura.  No nausea/vomiting. Still getting poor sleep, feels tired. Does not have the headache when he wakes up. Does have allergies, currently under control. Taking Tylenol 500 mg once a day, helps a bit.  Feet burning Soles of feet have been burning for a few weeks. Feels the most when he is at home and reclining in bed. No frequent urination, no history of diabetes, last A1c a few years ago. No specific association with showering or baths. No issues with balance.  PERTINENT PMH / PSH: Leaving on Saturday 8/3 for [place] to visit family. HTN, allergies, and glaucoma.   OBJECTIVE:   BP 137/87   Pulse 72   Ht 5\' 10"  (1.778 m)   Wt 178 lb (80.7 kg)   SpO2 100%   BMI 25.54 kg/m   General: Age-appropriate, resting comfortably in chair, NAD, alert and at baseline. HEENT:  Head: Normocephalic, atraumatic. No tenderness to percussion over sinuses. Eyes: PERRLA. No conjunctival erythema or scleral injections. Ears: TMs non-bulging and non-erythematous bilaterally. No erythema of external ear canal. No cerumen impaction. Nose: Non-erythematous turbinates. No rhinorrhea. Neck: Supple. Skin: Warm and dry. Extremities: No peripheral edema bilaterally. Capillary refill <2 seconds.  No lesions or rashes on extremities bilaterally.  No numbness to palpation, sensory discrimination with monofilament intact bilaterally.  Neurological Examination: MS: Awake, alert, interactive.  Normal eye contact, answered the questions appropriately, speech was fluent, normal comprehension.  Attention and concentration were normal. Cranial Nerves: CNs II-XII intact. Tone: Normal Strength: Normal strength in all muscle groups Sensation: Intact to light touch, temperature, vibration, Romberg negative. Coordination: No difficulty with balance. Gait: Normal walk.   ASSESSMENT/PLAN:   Problem List Items Addressed This Visit       Other   Enlarged prostate   Relevant Medications   tamsulosin (FLOMAX) 0.4 MG CAPS capsule   Seasonal allergies   Relevant Medications   cetirizine (ZYRTEC) 10 MG tablet   New daily persistent headache - Primary    Given new onset and persistent nature of headache in this older adult, concern for serious etiology is present.  Will obtain CT head and if negative, considering migraine versus tension type headache exacerbated by insomnia, heat, and dehydration.  Would be unusual for first onset of migraine to be at 57.  Considered atypical depression presentation given age and PHQ-9 score of 13, but believe PHQ-9 score is due to patient's lack of sleep and tiredness. - CT scan prior to travel on 8/3      Relevant Orders   CT SCANOGRAM   Burning sensation of feet    Suspect peripheral neuropathy.  No known diabetes diagnosis.  Differential includes diabetes, syphilis, vitamin B deficiency, hypo/hyperthyroidism, and dyscrasias.  Without known cause and given acute onset, will hold off on EMG and will instead opt for high yield laboratory testing. - RPR, B12, folate, Hgb A1c, TSH, SPEP      Relevant Orders   HgB A1c   Folate   Protein electrophoresis, serum  Vitamin B12   TSH   RPR   Other Visit Diagnoses     Diabetes mellitus screening       Relevant Orders   HgB A1c      Return if symptoms worsen or fail to improve.  Lowry Bala Sharion Dove, MD [facility]

## 2023-06-29 NOTE — Assessment & Plan Note (Addendum)
Suspect peripheral neuropathy.  No known diabetes diagnosis.  Differential includes diabetes, syphilis, vitamin B deficiency, hypo/hyperthyroidism, and dyscrasias.  Without known cause and given acute onset, will hold off on EMG and will instead opt for high yield laboratory testing. - RPR, B12, folate, Hgb A1c, TSH, SPEP

## 2023-06-29 NOTE — Assessment & Plan Note (Signed)
Given new onset and persistent nature of headache in this older adult, concern for serious etiology is present.  Will obtain CT head and if negative, considering migraine versus tension type headache exacerbated by insomnia, heat, and dehydration.  Would be unusual for first onset of migraine to be at 57.  Considered atypical depression presentation given age and PHQ-9 score of 13, but believe PHQ-9 score is due to patient's lack of sleep and tiredness. - CT scan prior to travel on 8/3

## 2023-06-30 ENCOUNTER — Other Ambulatory Visit (INDEPENDENT_AMBULATORY_CARE_PROVIDER_SITE_OTHER): Payer: Medicaid Other

## 2023-06-30 ENCOUNTER — Telehealth: Payer: Self-pay

## 2023-06-30 DIAGNOSIS — Z131 Encounter for screening for diabetes mellitus: Secondary | ICD-10-CM | POA: Diagnosis present

## 2023-06-30 DIAGNOSIS — G4452 New daily persistent headache (NDPH): Secondary | ICD-10-CM

## 2023-06-30 DIAGNOSIS — R208 Other disturbances of skin sensation: Secondary | ICD-10-CM

## 2023-06-30 LAB — POCT GLYCOSYLATED HEMOGLOBIN (HGB A1C): Hemoglobin A1C: 5.4 % (ref 4.0–5.6)

## 2023-06-30 NOTE — Telephone Encounter (Signed)
Called [facility] and had orders changed. Time and date is still the same. Aquilla Solian, CMA

## 2023-06-30 NOTE — Telephone Encounter (Signed)
Spoke with patient. Informed of CT scan at [facility] Aug. 1st at 4:00p. Patient is also coming in today for lab visit. Aquilla Solian, CMA

## 2023-06-30 NOTE — Telephone Encounter (Signed)
Advised patient no food 4 hours prior to CT.   Liquids only.   Patient voiced understanding.

## 2023-06-30 NOTE — Telephone Encounter (Signed)
Received order to cosign. CT Scanogram is the incorrect order for this patient CT scanogram is done for leg length discrepancy  This patient needs a CT head without contrast. I am not able to cancel the CT scanogram order since it has an appointment attached.  Will place head CT order.  [employer] can you correct this with radiology scheduling?  FYI to Dr. Sharion Dove  Latrelle Dodrill, MD

## 2023-07-02 ENCOUNTER — Ambulatory Visit (HOSPITAL_COMMUNITY)
Admission: RE | Admit: 2023-07-02 | Discharge: 2023-07-02 | Disposition: A | Discharge status: Home / Self Care | Payer: Medicaid Other | Source: Ambulatory Visit | Attending: Family Medicine | Admitting: Family Medicine

## 2023-07-02 DIAGNOSIS — G4452 New daily persistent headache (NDPH): Secondary | ICD-10-CM | POA: Diagnosis not present

## 2023-07-02 DIAGNOSIS — R519 Headache, unspecified: Secondary | ICD-10-CM | POA: Diagnosis not present

## 2023-07-03 ENCOUNTER — Telehealth: Payer: Self-pay

## 2023-07-03 NOTE — Telephone Encounter (Signed)
Patient calls nurse line requesting CT and Lab results.   Advised CT is not back yet.  He reports he would like to be called today in regards to lab results. He reports he is traveling out of the country tomorrow.   Will forward to PCP.

## 2023-07-04 ENCOUNTER — Encounter: Payer: Self-pay | Admitting: Family Medicine

## 2023-07-04 NOTE — Telephone Encounter (Signed)
Spoke with patient concerning TSH, RPR, folate, B12, SPEP being normal.  Noted that CT scan has not been read yet and that patient will leave on his trip before it comes back.

## 2023-07-19 ENCOUNTER — Encounter: Payer: Self-pay | Admitting: Family Medicine

## 2023-07-19 NOTE — Progress Notes (Signed)
Sent patient letter addressing normal CT head results.

## 2023-07-29 DIAGNOSIS — H402232 Chronic angle-closure glaucoma, bilateral, moderate stage: Secondary | ICD-10-CM | POA: Diagnosis not present

## 2023-07-29 DIAGNOSIS — H04123 Dry eye syndrome of bilateral lacrimal glands: Secondary | ICD-10-CM | POA: Diagnosis not present

## 2023-07-29 DIAGNOSIS — H2513 Age-related nuclear cataract, bilateral: Secondary | ICD-10-CM | POA: Diagnosis not present

## 2023-07-29 DIAGNOSIS — H43811 Vitreous degeneration, right eye: Secondary | ICD-10-CM | POA: Diagnosis not present

## 2023-10-21 DIAGNOSIS — H402232 Chronic angle-closure glaucoma, bilateral, moderate stage: Secondary | ICD-10-CM | POA: Diagnosis not present

## 2023-10-21 DIAGNOSIS — H04123 Dry eye syndrome of bilateral lacrimal glands: Secondary | ICD-10-CM | POA: Diagnosis not present

## 2023-10-21 DIAGNOSIS — H2513 Age-related nuclear cataract, bilateral: Secondary | ICD-10-CM | POA: Diagnosis not present

## 2023-12-23 DIAGNOSIS — H2513 Age-related nuclear cataract, bilateral: Secondary | ICD-10-CM | POA: Diagnosis not present

## 2023-12-23 DIAGNOSIS — L930 Discoid lupus erythematosus: Secondary | ICD-10-CM | POA: Diagnosis not present

## 2023-12-23 DIAGNOSIS — E109 Type 1 diabetes mellitus without complications: Secondary | ICD-10-CM | POA: Diagnosis not present

## 2023-12-23 DIAGNOSIS — H40023 Open angle with borderline findings, high risk, bilateral: Secondary | ICD-10-CM | POA: Diagnosis not present

## 2023-12-23 DIAGNOSIS — H401113 Primary open-angle glaucoma, right eye, severe stage: Secondary | ICD-10-CM | POA: Diagnosis not present

## 2023-12-23 DIAGNOSIS — Z79899 Other long term (current) drug therapy: Secondary | ICD-10-CM | POA: Diagnosis not present

## 2023-12-23 DIAGNOSIS — H401131 Primary open-angle glaucoma, bilateral, mild stage: Secondary | ICD-10-CM | POA: Diagnosis not present

## 2023-12-23 DIAGNOSIS — H402232 Chronic angle-closure glaucoma, bilateral, moderate stage: Secondary | ICD-10-CM | POA: Diagnosis not present

## 2023-12-23 DIAGNOSIS — H26492 Other secondary cataract, left eye: Secondary | ICD-10-CM | POA: Diagnosis not present

## 2023-12-23 DIAGNOSIS — H04123 Dry eye syndrome of bilateral lacrimal glands: Secondary | ICD-10-CM | POA: Diagnosis not present

## 2023-12-23 DIAGNOSIS — H02889 Meibomian gland dysfunction of unspecified eye, unspecified eyelid: Secondary | ICD-10-CM | POA: Diagnosis not present

## 2024-01-27 ENCOUNTER — Ambulatory Visit (INDEPENDENT_AMBULATORY_CARE_PROVIDER_SITE_OTHER): Payer: Medicaid Other | Admitting: Family Medicine

## 2024-01-27 ENCOUNTER — Encounter: Payer: Self-pay | Admitting: Family Medicine

## 2024-01-27 VITALS — BP 118/80 | HR 75 | Wt 179.4 lb

## 2024-01-27 DIAGNOSIS — I10 Essential (primary) hypertension: Secondary | ICD-10-CM

## 2024-01-27 DIAGNOSIS — E7800 Pure hypercholesterolemia, unspecified: Secondary | ICD-10-CM

## 2024-01-27 DIAGNOSIS — Z8639 Personal history of other endocrine, nutritional and metabolic disease: Secondary | ICD-10-CM

## 2024-01-27 DIAGNOSIS — Z0000 Encounter for general adult medical examination without abnormal findings: Secondary | ICD-10-CM | POA: Diagnosis not present

## 2024-01-27 NOTE — Assessment & Plan Note (Signed)
 Continue rosuvastatin 10 mg. -Routine lipid panel

## 2024-01-27 NOTE — Assessment & Plan Note (Signed)
 BP at goal on amlodipine 5 mg, continue therapy. -Routine BMP

## 2024-01-27 NOTE — Patient Instructions (Addendum)
 It was great to see you today! Thank you for choosing [facility] for your primary care.  Today we addressed: Everything looks great today!  Please start walking more outside as the weather gets warmer.  I recommend walking at a quick pace for better exercise.  Please do not use Q-tips in your ears, as this gets rid of too much earwax and there is a risk of puncturing your eardrum.  If you stop using them, your ears will be less irritated and itchy after a while.  I recommend that you schedule your colonoscopy to help detect colon cancer.  If you decide for this, please ask about the cologuard as an option.  You can get the Shingrix vaccine at the pharmacy. You will need a 2nd shot 2-6 months after the first. This vaccine is important to help prevent shingles, which is a painful version of chicken pox for adults.  We are checking some labs today, including metabolic panel, vitamin D, and lipid panel.  You will get a MyChart message or a letter if results are normal. Otherwise, you will get a call from [place].  If you had a referral placed, they will call you to set up an appointment. Please give [place] a call if you don't hear back in the next 2 weeks.  You should return to our clinic in 1 year.  Thank you for coming to see [place] at [facility] and for the opportunity to care for you! Joran Kallal, MD 01/27/2024, 11:09 AM
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
## 2024-01-28 LAB — BASIC METABOLIC PANEL
BUN/Creatinine Ratio: 12 (ref 10–24)
BUN: 11 mg/dL (ref 8–27)
CO2: 24 mmol/L (ref 20–29)
Calcium: 9.1 mg/dL (ref 8.6–10.2)
Chloride: 102 mmol/L (ref 96–106)
Creatinine, Ser: 0.9 mg/dL (ref 0.76–1.27)
Glucose: 93 mg/dL (ref 70–99)
Potassium: 4.1 mmol/L (ref 3.5–5.2)
Sodium: 139 mmol/L (ref 134–144)
eGFR: 98 mL/min/{1.73_m2} (ref >59)

## 2024-01-28 LAB — LIPID PANEL
Chol/HDL Ratio: 6.5 {ratio} — ABNORMAL HIGH (ref 0.0–5.0)
Cholesterol, Total: 241 mg/dL — ABNORMAL HIGH (ref 100–199)
HDL: 37 mg/dL — ABNORMAL LOW (ref >39)
LDL Chol Calc (NIH): 140 mg/dL — ABNORMAL HIGH (ref 0–99)
Triglycerides: 348 mg/dL — ABNORMAL HIGH (ref 0–149)
VLDL Cholesterol Cal: 64 mg/dL — ABNORMAL HIGH (ref 5–40)

## 2024-01-28 LAB — VITAMIN D 25 HYDROXY (VIT D DEFICIENCY, FRACTURES): Vit D, 25-Hydroxy: 16.3 ng/mL — ABNORMAL LOW (ref 30.0–100.0)

## 2024-01-30 ENCOUNTER — Other Ambulatory Visit: Payer: Self-pay | Admitting: Family Medicine

## 2024-02-03 ENCOUNTER — Telehealth: Payer: Self-pay | Admitting: Family Medicine

## 2024-02-03 DIAGNOSIS — E559 Vitamin D deficiency, unspecified: Secondary | ICD-10-CM

## 2024-02-03 DIAGNOSIS — E7800 Pure hypercholesterolemia, unspecified: Secondary | ICD-10-CM

## 2024-02-03 MED ORDER — ROSUVASTATIN CALCIUM 20 MG PO TABS: 20.0000 mg | ORAL_TABLET | Freq: Every day | ORAL | 6 refills | Status: DC

## 2024-02-03 MED ORDER — VITAMIN D (ERGOCALCIFEROL) 1.25 MG (50000 UNIT) PO CAPS
50000.0000 [IU] | ORAL_CAPSULE | ORAL | 0 refills | Status: AC
Start: 2024-02-03 — End: ?

## 2024-02-03 NOTE — Telephone Encounter (Signed)
 Called patient, verified DOB.  Notable increase in LDL from 1 year ago.  ASCVD 10 year risk 12.8%, would be 5.7% if factors optimized.  Currently on rosuvastatin 10 mg, requires moderate to high intensity therapy. -After discussion with patient, will move forward to high intensity, increase to 20 mg daily  Vitamin D still low. -After discussion with patient, will send vitamin D 50,000 IU once weekly for 8 weeks  BMP unremarkable.

## 2024-03-10 ENCOUNTER — Encounter: Payer: Self-pay | Admitting: Family Medicine

## 2024-03-10 ENCOUNTER — Ambulatory Visit: Admitting: Family Medicine

## 2024-03-10 VITALS — BP 139/87 | HR 80 | Ht 70.0 in | Wt 176.8 lb

## 2024-03-10 DIAGNOSIS — Z0000 Encounter for general adult medical examination without abnormal findings: Secondary | ICD-10-CM

## 2024-03-10 DIAGNOSIS — H259 Unspecified age-related cataract: Secondary | ICD-10-CM | POA: Diagnosis not present

## 2024-03-10 DIAGNOSIS — Z139 Encounter for screening, unspecified: Secondary | ICD-10-CM | POA: Diagnosis not present
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
## 2024-03-10 NOTE — Patient Instructions (Signed)
 It was great to see you today! Thank you for choosing [facility] for your primary care.  Today we addressed: 1. Encounter for screening involving social determinants of health (SDoH) I have referred you to our care coordinators and clinical social workers.  They will reach out and call as soon as possible, almost certainly within 1 week.  Let us know if they do not call within 1 week.  2. Cataracts Please schedule an appointment with your paperwork after your son's wedding so that we can get you cleared for surgery before the end of June.  Thank you for coming to see [place] at [facility] and for the opportunity to care for you! Shantai Tiedeman, MD 03/10/2024, 10:59 AM

## 2024-03-14 ENCOUNTER — Telehealth: Payer: Self-pay | Admitting: *Deleted

## 2024-03-14 NOTE — Progress Notes (Signed)
 Complex Care Management Note Care Guide Note  03/14/2024 Name: Michael Fowler MRN: [id number] DOB: 10-17-1966  Michael Fowler is a 58 y.o. year old male who is a primary care patient of Shitarev, Dimitry, MD . The community resource team was consulted for assistance with  SDOH screening   SDOH screenings and interventions completed:  Yes  Social Drivers of Health From This Encounter   Food Insecurity: No Food Insecurity (03/14/2024)   Hunger Vital Sign    Worried About Running Out of Food in the Last Year: Never true    Ran Out of Food in the Last Year: Never true  Housing: Unknown (03/14/2024)   Housing Stability Vital Sign    Unable to Pay for Housing in the Last Year: No    Homeless in the Last Year: No  Financial Resource Strain: Low Risk  (03/14/2024)   Overall Financial Resource Strain (CARDIA)    Difficulty of Paying Living Expenses: Not very hard  Transportation Needs: No Transportation Needs (03/14/2024)   PRAPARE - Administrator, Civil Service (Medical): No    Lack of Transportation (Non-Medical): No  Utilities: Not At Risk (03/14/2024)   Utilities    Threatened with loss of utilities: No    SDOH Interventions Today    Flowsheet Row Most Recent Value  SDOH Interventions   Food Insecurity Interventions Intervention Not Indicated  Housing Interventions Intervention Not Indicated  Transportation Interventions Payor Benefit  Utilities Interventions Community Resources Provided  [utility resources provided]  Financial Strain Interventions Intervention Not Indicated, Community Resources Provided        Care guide performed the following interventions: Patient provided with information about care guide support team and interviewed to confirm resource needs.  Follow Up Plan:  No further follow up planned at this time. The patient has been provided with needed resources.  Encounter Outcome:  Patient Visit Completed  Rawn Quiroa  [place]  [facility] HealthPopulation Health Care Guide  Direct Dial:[phone] Fax:[phone] Website: [email or web address]

## 2024-03-17 ENCOUNTER — Telehealth: Payer: Self-pay | Admitting: *Deleted

## 2024-03-17 NOTE — Progress Notes (Signed)
 Complex Care Management Note Care Guide Note  03/17/2024 Name: Michael Fowler MRN: [id number] DOB: [birth year]   Complex Care Management Outreach Attempts: An unsuccessful telephone outreach was attempted today to offer the patient information about available complex care management services.  Follow Up Plan:  Additional outreach attempts will be made to offer the patient complex care management information and services.   Encounter Outcome:  No Answer  Barnie Bora  [facility]  [facility], [facility] Guide  Direct Dial: [phone]  Fax [phone]

## 2024-03-17 NOTE — Progress Notes (Signed)
 Complex Care Management Note  Care Guide Note 03/17/2024 Name: Michael Fowler MRN: [id number] DOB: Apr 25, 1966  Michael Fowler is a 58 y.o. year old male who sees Shitarev, Dimitry, MD for primary care. I reached out to [facility] by phone today to offer complex care management services.  Michael Fowler was given information about Complex Care Management services today including:   The Complex Care Management services include support from the care team which includes your Nurse Care Manager, Clinical Social Worker, or Pharmacist.  The Complex Care Management team is here to help remove barriers to the health concerns and goals most important to you. Complex Care Management services are voluntary, and the patient may decline or stop services at any time by request to their care team member.   Complex Care Management Consent Status: Patient agreed to services and verbal consent obtained.   Follow up plan:  Telephone appointment with complex care management team member scheduled for:  5/22  Encounter Outcome:  Patient Scheduled  Michael Fowler  [facility]  [facility], [facility] Guide  Direct Dial: [phone]  Fax [phone]

## 2024-04-21 ENCOUNTER — Other Ambulatory Visit: Payer: Self-pay

## 2024-04-21 DIAGNOSIS — K219 Gastro-esophageal reflux disease without esophagitis: Secondary | ICD-10-CM

## 2024-04-21 DIAGNOSIS — I10 Essential (primary) hypertension: Secondary | ICD-10-CM

## 2024-04-21 DIAGNOSIS — R208 Other disturbances of skin sensation: Secondary | ICD-10-CM

## 2024-04-21 DIAGNOSIS — Z789 Other specified health status: Secondary | ICD-10-CM
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
## 2024-04-21 NOTE — Patient Instructions (Signed)
 Visit Information  Michael Fowler was given information about Medicaid Managed Care team care coordination services as a part of their Healthy Blue Medicaid benefit. Michael Fowler verbally consented to engagement with the [facility] Managed Care team.   If you are experiencing a medical emergency, please call 911 or report to your local emergency department or urgent care.   If you have a non-emergency medical problem during routine business hours, please contact your provider's office and ask to speak with a nurse.   For questions related to your Healthy [facility] health plan, please call: [phone] or visit the homepage here: [email or web address]  If you would like to schedule transportation through your Healthy [facility] plan, please call the following number at least 2 days in advance of your appointment: [phone]  For information about your ride after you set it up, call Ride Assist at [phone]. Use this number to activate a Will Call pickup, or if your transportation is late for a scheduled pickup. Use this number, too, if you need to make a change or cancel a previously scheduled reservation.  If you need transportation services right away, call [phone]. The after-hours call center is staffed 24 hours to handle ride assistance and urgent reservation requests (including discharges) 365 days a year. Urgent trips include sick visits, hospital discharge requests and life-sustaining treatment.  Call the [facility] Line at [phone], at any time, 24 hours a day, 7 days a week. If you are in danger or need immediate medical attention call 911.  If you would like help to quit smoking, call 1-800-QUIT-NOW (973-343-0869) OR Espaol: 1-855-Djelo-Ya (4-742-595-6387) o para ms informacin haga clic aqu or Text READY to 564-332 to register via text  Michael Fowler - following are the goals we discussed in  your visit today:   Goals Addressed             This Visit's Progress    VBCI RN Care Plan       Problems:  Care Coordination needs related to Financial Strain due to unemployment and inability to pay lights and gas bills.  Goal: Over the next 2 weeks the Patient will work with Child psychotherapist to address Financial constraints related to unemployment related to the management of lights and gas bills as evidenced by review of electronic medical record and patient or social worker report      Interventions:   SDOH Barriers Patient interviewed and SDOH assessment performed        SDOH Interventions    Flowsheet Row Patient Outreach Telephone from 04/21/2024 in [place] POPULATION HEALTH DEPARTMENT Telephone from 03/14/2024 in [place] POPULATION HEALTH DEPARTMENT  SDOH Interventions    Food Insecurity Interventions Intervention Not Indicated Intervention Not Indicated  Housing Interventions Intervention Not Indicated Intervention Not Indicated  Transportation Interventions Intervention Not Indicated Payor Benefit  Utilities Interventions Other (Comment)  [send BSW referral for help] [employer] Provided  [employer] resources provided]  Financial Strain Interventions Other (Comment)  [BSW] Intervention Not Indicated, Programmer, applications Provided      Patient interviewed and appropriate assessments performed Advised patient to BSW will call and talk with him about  Patient Self-Care Activities:  Work with the Child psychotherapist to address care coordination needs and will continue to work with the clinical team to address health care and disease management related needs  Plan:  In 2 weeks I will follow up BSW to see if they have connected with the patient.  Please see education materials related to resources provided verbally  The patient verbalized understanding of instructions, educational materials, and care plan provided today and DECLINED offer to  receive copy of patient instructions, educational materials, and care plan.   In 2 weeks I will follow up BSW to see if they have connected with the patient.   Augustin Leber RN, BSN, [facility]  [facility], [facility]  Care Coordinator Phone: [phone]      Following is a copy of your plan of care:  There are no care plans that you recently modified to display for this patient.

## 2024-04-29 ENCOUNTER — Ambulatory Visit (INDEPENDENT_AMBULATORY_CARE_PROVIDER_SITE_OTHER): Admitting: Student

## 2024-04-29 ENCOUNTER — Other Ambulatory Visit: Payer: Self-pay

## 2024-04-29 VITALS — BP 146/85 | HR 75 | Ht 69.0 in | Wt 176.4 lb

## 2024-04-29 DIAGNOSIS — N410 Acute prostatitis: Secondary | ICD-10-CM

## 2024-04-29 DIAGNOSIS — I10 Essential (primary) hypertension: Secondary | ICD-10-CM | POA: Diagnosis not present

## 2024-04-29 MED ORDER — CIPROFLOXACIN HCL 500 MG PO TABS: 500.0000 mg | ORAL_TABLET | Freq: Two times a day (BID) | ORAL | 0 refills | Status: AC

## 2024-04-29 MED ORDER — AMLODIPINE BESYLATE 5 MG PO TABS: 5.0000 mg | ORAL_TABLET | Freq: Every day | ORAL | 3 refills | Status: DC

## 2024-04-29 NOTE — Patient Instructions (Signed)
 It was great to see you! Thank you for allowing me to participate in your care!  I recommend that you always bring your medications to each appointment as this makes it easy to ensure we are on the correct medications and helps us  not miss when refills are needed.  Our plans for today:  - Prostatitis We are treating you for an infection of your prostate. This will take some time to get better, but will get better with antibiotics. We will want to see you back in 1 week.   Make a follow up appointment in 1 week  Take Ciprofloxacin 500 mg twice a day. You will need this for at least 2 weeks, but we will send you 1 weeks worth of antibiotics.  We are checking some labs today, I will call you if they are abnormal will send you a MyChart message or a letter if they are normal.  If you do not hear about your labs in the next 2 weeks please let us  know.  Take care and seek immediate care sooner if you develop any concerns.   Dr. Wilhemena Harbour, MD [facility] Medicine

## 2024-04-29 NOTE — Assessment & Plan Note (Addendum)
 Patient comes in with 3 days of symptoms of burning/discomfort with urination, and tenderness located in perineal area.  Patient denies any fevers, reports peeing is going normal now, and also having normal defecations.  Patient has tenderness to palpation on prostate exam prostate also noted to be enlarged.  Penile and testicular exam were normal, done with chaperone present.  Suspect possible prostatitis will treat with close follow-up. - Cipro 500 twice daily x 2 weeks - UA - Follow-up 1 week

## 2024-04-29 NOTE — Progress Notes (Addendum)
  SUBJECTIVE:   CHIEF COMPLAINT / HPI:   Pain w/ Urination For the past two to three days, he has experienced a sensation of inflammation and urgency when urinating, with frequent urges to urinate every few minutes, often producing only a small amount of urine or just a few drops. Drinking water seems to help alleviate the symptoms.  He has a history of using tamsulosin , which previously helped manage his symptoms, but he has not been on it recently. He took one Aleve today to help with the discomfort.  He reports occasional constipation and a burning sensation when defecating, which he associates with his urinary symptoms. After increasing his water intake, his bowel movements have softened, and the burning sensation has resolved.  No tenderness in the testicles, but there is tenderness in the area between the testicles. No current burning during urination, but he describes a feeling of 'little inflammation'. He has been managing his symptoms by drinking water at home and avoiding spicy foods like hot sauce. Is taking his flomax .   HTN Didn't take his BP meds today  PERTINENT  PMH / PSH:   OBJECTIVE:  BP (!) 146/85   Pulse 75   Ht 5\' 9"  (1.753 m)   Wt 176 lb 6.4 oz (80 kg)   SpO2 100%   BMI 26.05 kg/m  Physical Exam Exam conducted with a chaperone present.  Abdominal:     Hernia: There is no hernia in the left inguinal area or right inguinal area.  Genitourinary:    Penis: Normal and circumcised. No erythema, tenderness, discharge, swelling or lesions.      Testes: Normal.        Right: Mass, tenderness, swelling, testicular hydrocele or varicocele not present. Right testis is descended.        Left: Mass, tenderness, swelling, testicular hydrocele or varicocele not present. Left testis is descended.     Epididymis:     Right: Normal. No tenderness.     Left: Normal. No tenderness.     Prostate: Enlarged and tender.     Rectum: Normal. No tenderness, anal fissure or external  hemorrhoid. Normal anal tone.      ASSESSMENT/PLAN:   Assessment & Plan Acute prostatitis Patient comes in with 3 days of symptoms of burning/discomfort with urination, and tenderness located in perineal area.  Patient denies any fevers, reports peeing is going normal now, and also having normal defecations.  Patient has tenderness to palpation on prostate exam prostate also noted to be enlarged.  Penile and testicular exam were normal, done with chaperone present.  Suspect possible prostatitis will treat with close follow-up. - Cipro 500 twice daily x 2 weeks - UA - Follow-up 1 week Primary hypertension Did not take BP meds today, will f/u next week for recheck. -F/u next week for BP recheck -Continue amlodipine  5 mg daily No follow-ups on file. Wilhemena Harbour, MD 04/29/2024, 2:32 PM PGY-3, [facility]

## 2024-04-29 NOTE — Assessment & Plan Note (Addendum)
 Did not take BP meds today, will f/u next week for recheck. -F/u next week for BP recheck -Continue amlodipine  5 mg daily

## 2024-04-30 LAB — URINALYSIS
Bilirubin, UA: NEGATIVE
Glucose, UA: NEGATIVE
Ketones, UA: NEGATIVE
Leukocytes,UA: NEGATIVE
Nitrite, UA: NEGATIVE
Protein,UA: NEGATIVE
RBC, UA: NEGATIVE
Specific Gravity, UA: 1.017 (ref 1.005–1.030)
Urobilinogen, Ur: 0.2 mg/dL (ref 0.2–1.0)
pH, UA: 6.5 (ref 5.0–7.5)

## 2024-05-02 ENCOUNTER — Ambulatory Visit: Payer: Self-pay | Admitting: Student

## 2024-05-04 ENCOUNTER — Telehealth: Payer: Self-pay | Admitting: *Deleted

## 2024-05-04 NOTE — Progress Notes (Signed)
 Complex Care Management Care Guide Note  05/04/2024 Name: Jean Skow MRN: [id number] DOB: 01-07-1966  Aizen Sanel Stemmer is a 58 y.o. year old male who is a primary care patient of Shitarev, Dimitry, MD and is actively engaged with the care management team. I reached out to [facility] by phone today to assist with scheduling  with the BSW.  Follow up plan: Telephone appointment with complex care management team member scheduled for:  05/09/24  Barnie Bora  [facility]  [facility], [facility] Guide  Direct Dial: [phone]  Fax [phone]

## 2024-05-06 ENCOUNTER — Encounter: Payer: Self-pay | Admitting: Family Medicine

## 2024-05-06 ENCOUNTER — Ambulatory Visit: Admitting: Family Medicine

## 2024-05-06 VITALS — BP 130/80 | HR 81 | Ht 69.0 in | Wt 175.0 lb

## 2024-05-06 DIAGNOSIS — N410 Acute prostatitis: Secondary | ICD-10-CM

## 2024-05-06 NOTE — Progress Notes (Signed)
   SUBJECTIVE:   CHIEF COMPLAINT / HPI:  Michael Fowler is a 58 y.o. male with a pertinent past medical history of GERD, BPH, and glaucoma presenting to the clinic for   Acute prostatitis Started ciprofloxacin  500 mg BID on 04/29/24 in clinic by Dr. Tenna Fees, prostate tenderness noted then. Now returning for 1 week follow up. UA was unremarkable. Symptoms are better on ciprofloxacin . Has some urinary dribbling and difficulty with urination at baseline. Previously had dysuria at start of infection. No more burning with urination, feels better overall, but still feels something is "off a bit." Good urine output now with good oral hydration. No fevers, chills, body aches. Prostate exam deferred. Has noticed some diminished ejaculation with intercourse, describes poor ejection of semen, but no pain with ejaculation, no blood in seminal fluid.  This is not a new change, notes this has been going on for several months.  PERTINENT PMH / PSH: L eye central retinal vein occlusion and glaucoma s/p cataract surgery BPH GERD   OBJECTIVE:   BP 130/80   Pulse 81   Ht 5\' 9"  (1.753 m)   Wt 175 lb (79.4 kg)   SpO2 95%   BMI 25.84 kg/m   General: Age-appropriate, resting comfortably in chair, NAD, alert and at baseline. Cardiovascular: Regular rate and rhythm. Normal S1/S2. No murmurs, rubs, or gallops appreciated. 2+ radial pulses. Abdominal: No tenderness to deep or light palpation including over suprapubic region. No rebound or guarding. No HSM. GU: Deferred after discussion with patient. Skin: No rashes grossly. Extremities: No peripheral edema bilaterally. Capillary refill <2 seconds.   ASSESSMENT/PLAN:   Assessment & Plan Acute prostatitis Appears clinically improved.  Doing well.  Does have baseline BPH on tamsulosin .  Most recent PSA 2015, WNL. - Continue ciprofloxacin  500 mg BID for full 2 week course (5/30-6/13) - Continue oral hydration, tamsulosin  - Follow up if  symptoms not resolved after treatment - PSA discussed and declined  Alandra Sando Lansing Planas, MD [facility] Center

## 2024-05-06 NOTE — Assessment & Plan Note (Signed)
 Appears clinically improved.  Doing well.  Does have baseline BPH on tamsulosin .  Most recent PSA 2015, WNL. - Continue ciprofloxacin  500 mg BID for full 2 week course (5/30-6/13) - Continue oral hydration, tamsulosin  - Follow up if symptoms not resolved after treatment - PSA discussed and declined

## 2024-05-06 NOTE — Patient Instructions (Addendum)
 It was great to see you today! Thank you for choosing [facility] for your primary care.  Today we addressed: Prostatitis I am glad you are doing better!  Please let me know if you have any symptoms 2 weeks from now.  If you had a referral placed, they will call you to set up an appointment. Please give us  a call if you don't hear back in the next 2 weeks.  You should return to our clinic in 6 months or as needed.  Thank you for coming to see us  at [facility] and for the opportunity to care for you! Lansing Planas, Helyn Schwan, MD 05/06/2024, 2:57 PM

## 2024-05-07 ENCOUNTER — Telehealth: Payer: Self-pay | Admitting: Student

## 2024-05-07 ENCOUNTER — Other Ambulatory Visit: Payer: Self-pay | Admitting: Student

## 2024-05-07 MED ORDER — CIPROFLOXACIN HCL 500 MG PO TABS: 500.0000 mg | ORAL_TABLET | Freq: Two times a day (BID) | ORAL | 0 refills | Status: AC

## 2024-05-07 NOTE — Telephone Encounter (Signed)
**  After Hours/ Emergency Line Call**  Received a page to call [phone].  Patient: Michael Fowler [facility]  Caller: Self  Confirmed name & DOB of patient with caller  Subjective:  F/u w/ PCP who noted that he was supposed to continue abx for another 2 weeks. Pt went to pharmacy and they said there was nothing there to pick up. Reviewed note from yesterday's visit, and no orders noted.     Objective:  Observations: NAD    Assessment & Plan  Michael Fowler is a 58 y.o. male with PMHx of acute prostatitis who calls with the following complaints and concerns: needing his abx refilled. They were not sent to the pharmacy. Will send Rx for patient to pharmacy on [address].    -- Will forward to PCP.  Michael Harbour, MD [facility] Family Medicine Residency, PGY-3

## 2024-05-09 ENCOUNTER — Other Ambulatory Visit: Payer: Self-pay

## 2024-05-09 NOTE — Patient Instructions (Signed)
 Visit Information  Michael Fowler was given information about Medicaid Managed Care team care coordination services as a part of their Healthy Blue Medicaid benefit. Michael Fowler verbally consented to engagement with the [facility] Managed Care team.   If you are experiencing a medical emergency, please call 911 or report to your local emergency department or urgent care.   If you have a non-emergency medical problem during routine business hours, please contact your provider's office and ask to speak with a nurse.   For questions related to your Healthy [facility] health plan, please call: [phone] or visit the homepage here: [email or web address]  If you would like to schedule transportation through your Healthy [facility] plan, please call the following number at least 2 days in advance of your appointment: [phone]  For information about your ride after you set it up, call Ride Assist at [phone]. Use this number to activate a Will Call pickup, or if your transportation is late for a scheduled pickup. Use this number, too, if you need to make a change or cancel a previously scheduled reservation.  If you need transportation services right away, call [phone]. The after-hours call center is staffed 24 hours to handle ride assistance and urgent reservation requests (including discharges) 365 days a year. Urgent trips include sick visits, hospital discharge requests and life-sustaining treatment.  Call the [facility] Line at [phone], at any time, 24 hours a day, 7 days a week. If you are in danger or need immediate medical attention call 911.  If you would like help to quit smoking, call 1-800-QUIT-NOW ((252)437-2369) OR Espaol: 1-855-Djelo-Ya (7-106-269-4854) o para ms informacin haga clic aqu or Text READY to 627-035 to register via text  Michael Fowler - following are the goals we discussed in  your visit today:   Goals Addressed             This Visit's Progress    BSW VBCI Social Work Care Plan   On track    Problems:   Financial Strain  and utility hardships.  CSW Clinical Goal(s):   Over the next 2 weeks the Patient will work with Child psychotherapist to address concerns related to financial strain and utility assistance.  Interventions:  SW will provide Pt local resources for utility assistance related to financial hardship and refer Pt to [employer] for potential assistance with his home Engineer, site.   Patient Goals/Self-Care Activities:  Follow up with Out-of-the garden project for [employer] options. Pt will reach out to [employer], and [employer] provider to confirm financial hardship and inquire about any utility assistance.   Plan:   Telephone follow up appointment with care management team member scheduled for:  05/23/2024 at 10:30am        Please see education materials related to utility assistance and food pantries, and CHS informaiton provided by e-mail link. and as Financial risk analyst.   The patient verbalized understanding of instructions, educational materials, and care plan provided today and DECLINED offer to receive copy of patient instructions, educational materials, and care plan.   Follow up scheduled for: 05/23/2024 at 10:30AM.   Michael Fowler, BSW [place]/VBCI - [facility] Social Worker [phone]   Following is a copy of your plan of care:  There are no care plans that you recently modified to display for this patient.

## 2024-05-09 NOTE — Patient Outreach (Signed)
 SW called Pt to inform him he had sent over an email containing community resources for food and utility assistance to Pt email on file. SW informed Pt he tried contacting DSS to inquire about any additional assistance programs, but was on hold for 20 minutes and never answered. SW communicated to Pt he would try again tomorrow morning. Pt understood and agreed. SW has also put in request for resources to be mailed out to Pt. SW emailed patient application for DSS program Crisis Intervention Program which can help with past due or disconnected utility bills. This program begins accepting applications July 1st and this information was communicated to Pt. Pt understood and agreed to attempt to complete the application before then.
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
## 2024-05-09 NOTE — Telephone Encounter (Signed)
 He was in good spirits, and was happy he saw you!  -BS

## 2024-05-23 ENCOUNTER — Other Ambulatory Visit: Payer: Self-pay

## 2024-05-24 ENCOUNTER — Other Ambulatory Visit: Payer: Self-pay

## 2024-05-24 NOTE — Patient Outreach (Signed)
 SW attempted outreach during yesterday's (06/23) original appointment time and Pt instructed SW to reschedule for 4:30pm. SW calls back at 4:30pm and patient does not answer. SW left a VM stating he would try again to reach him first thing in the morning at 8:30am on 06/24. SW attempted multiple outgoing calls this morning to numbers on file and was not successful in establishing contact. VM was left stating for Pt to call back to reschedule.

## 2024-05-26 ENCOUNTER — Other Ambulatory Visit: Payer: Self-pay

## 2024-05-26 DIAGNOSIS — K219 Gastro-esophageal reflux disease without esophagitis: Secondary | ICD-10-CM

## 2024-05-26 MED ORDER — OMEPRAZOLE 20 MG PO CPDR
DELAYED_RELEASE_CAPSULE | ORAL | 1 refills | Status: AC
Start: 2024-05-26 — End: ?

## 2024-05-30 NOTE — Patient Outreach (Signed)
 SW re-attempted call to reshcedule patient. Patient did not answer and call went to voicemail. SW also emailed patient requesting to call back to get back on schedule and continue working with patient.

## 2024-06-09 ENCOUNTER — Ambulatory Visit: Payer: Self-pay
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
## 2024-06-15 ENCOUNTER — Other Ambulatory Visit: Payer: Self-pay | Admitting: Family Medicine

## 2024-06-15 DIAGNOSIS — N400 Enlarged prostate without lower urinary tract symptoms: Secondary | ICD-10-CM

## 2024-08-19 ENCOUNTER — Ambulatory Visit (INDEPENDENT_AMBULATORY_CARE_PROVIDER_SITE_OTHER): Admitting: Family Medicine

## 2024-08-19 ENCOUNTER — Other Ambulatory Visit: Payer: Self-pay | Admitting: Family Medicine

## 2024-08-19 VITALS — BP 110/75 | HR 77 | Temp 98.4°F | Wt 177.8 lb

## 2024-08-19 DIAGNOSIS — R3911 Hesitancy of micturition: Secondary | ICD-10-CM | POA: Diagnosis not present

## 2024-08-19 DIAGNOSIS — E7800 Pure hypercholesterolemia, unspecified: Secondary | ICD-10-CM

## 2024-08-19 DIAGNOSIS — N401 Enlarged prostate with lower urinary tract symptoms: Secondary | ICD-10-CM | POA: Diagnosis not present

## 2024-08-19 MED ORDER — FINASTERIDE 5 MG PO TABS: 5.0000 mg | ORAL_TABLET | Freq: Every day | ORAL | 1 refills | Status: AC

## 2024-08-19 NOTE — Assessment & Plan Note (Signed)
 LDL previously 140 in February 2025. - Continue rosuvastatin  20 mg daily - Recheck lipid panel today

## 2024-08-19 NOTE — Patient Instructions (Addendum)
 It was great to see you today! Thank you for choosing [facility] for your primary care.  Today we addressed: 1. Benign prostatic hyperplasia with urinary hesitancy We are checking a PSA today to see how your prostate is doing.  You will get a MyChart message or a letter if results are normal. Otherwise, you will get a call from us . I am prescribing finasteride  today to help with your symptoms.  I am also checking your cholesterol today.  Schedule your colonoscopy to help detect colon cancer. The stomach doctors will call to schedule an appt with you. If you decide against this, please ask about the cologuard as an option.     You should return to our clinic in 6 months.  Thank you for coming to see us  at [facility] and for the opportunity to care for you! Mykeal Carrick, MD 08/19/2024, 1:50 PM

## 2024-08-19 NOTE — Progress Notes (Signed)
   SUBJECTIVE:   CHIEF COMPLAINT / HPI:  Michael Fowler is a 58 y.o. male with a pertinent past medical history of GERD, BPH, and glaucoma presenting to the clinic for follow-up on BPH.  BPH Patient was last seen on 6/06 for prostatitis. He reports resolution of pain associated with this infection and states he is doing better. However, patient describes persistent urinary hesitancy and incomplete voiding. The symptoms were present prior to prostatitis, however patient feels they have somewhat worsened the past few months. Patient is finding these symptoms increasingly troublesome, especially with praying in his traditional garments which are difficult to remove for urination. Patient denies saddle paresthesias, bladder incontinence, stool incontinence, leg numbness, or weakness.  Patient briefly noted occasional constipation, has not tried MiraLAX . Patient again defers colonoscopy after discussion.  Patient also requests assistance with SSI/SSDI paperwork.  Does not have paperwork today.  Advised patient to follow-up with Social Security office directly, provided fax number for clinic and hospital medical records   PERTINENT PMH / PSH: L eye central retinal vein occlusion and glaucoma s/p cataract surgery BPH GERD  *Remainder reviewed in problem list.   OBJECTIVE:   BP 110/75   Pulse 77   Temp 98.4 F (36.9 C)   Wt 177 lb 12.8 oz (80.6 kg)   SpO2 95%   BMI 26.26 kg/m   General: Age-appropriate, resting comfortably in chair, NAD, alert and at baseline. Cardiovascular: Regular rate and rhythm. Normal S1/S2. No murmurs, rubs, or gallops appreciated. 2+ radial pulses. Pulmonary: Clear bilaterally to ascultation. No wheezes, crackles, or rhonchi. Normal WOB on room air. No accessory muscle use. Abdominal: No tenderness to deep or light palpation. No rebound or guarding. No HSM. Skin: Warm and dry.  No rashes grossly. Extremities: No peripheral edema bilaterally.  Capillary refill <2 seconds.   ASSESSMENT/PLAN:   Assessment & Plan Benign prostatic hyperplasia with urinary hesitancy Somewhat progressed symptoms since most recent follow-up.  After shared decision making concerning risks and benefits, patient elected to proceed with PSA. - PSA, further testing as indicated pending results - Start finasteride  5 mg daily - Continue tamsulosin  0.4 mg daily - Discussed OTC MiraLAX  for constipation as needed HYPERCHOLESTEROLEMIA LDL previously 140 in February 2025. - Continue rosuvastatin  20 mg daily - Recheck lipid panel today  Follow-up in 6 months or sooner if needed.  Tagan Bartram Toma, MD [facility]

## 2024-08-20 LAB — LIPID PANEL
Chol/HDL Ratio: 6.2 ratio — ABNORMAL HIGH (ref 0.0–5.0)
Cholesterol, Total: 212 mg/dL — ABNORMAL HIGH (ref 100–199)
HDL: 34 mg/dL — ABNORMAL LOW (ref >39)
LDL Chol Calc (NIH): 128 mg/dL — ABNORMAL HIGH (ref 0–99)
Triglycerides: 283 mg/dL — ABNORMAL HIGH (ref 0–149)
VLDL Cholesterol Cal: 50 mg/dL — ABNORMAL HIGH (ref 5–40)

## 2024-08-20 LAB — PSA: Prostate Specific Ag, Serum: 7.2 ng/mL — ABNORMAL HIGH (ref 0.0–4.0)

## 2024-08-25 ENCOUNTER — Ambulatory Visit: Payer: Self-pay | Admitting: Family Medicine

## 2024-08-25 DIAGNOSIS — E7800 Pure hypercholesterolemia, unspecified: Secondary | ICD-10-CM

## 2024-08-25 DIAGNOSIS — R972 Elevated prostate specific antigen [PSA]: Secondary | ICD-10-CM

## 2024-08-25 MED ORDER — ROSUVASTATIN CALCIUM 20 MG PO TABS: 40.0000 mg | ORAL_TABLET | Freq: Every day | ORAL | Status: AC

## 2024-08-25 NOTE — Progress Notes (Signed)
 Elevated PSA PSA elevated to 7.2, was NOT taking finasteride  at the time, as this was started AFTER the PSA was drawn.  Recommend recheck, but given degree of elevation, will go ahead and refer to Urology, suspect they will want to repeat the lab at first visit. Asked patient to call back in 2-3 weeks if he does not hear from Urology and emphasized importance of follow up. Discussed with patient that he should stop finasteride  for at least 2 weeks prior to Urology visit to avoid altering repeat PSA lab. - Ambulatory Urology referral  HLD LDL still above goal of <100 on rosuvastatin  20 mg.  Will increase to 40 mg at next refill and discussed with patient that he should take 2 pills of the 20 mg dose until then. - Increase rosuvastatin  to 40 mg daily, no print and refill at higher dose next time

## 2024-09-12 DIAGNOSIS — H402222 Chronic angle-closure glaucoma, left eye, moderate stage: Secondary | ICD-10-CM | POA: Diagnosis not present

## 2024-09-12 DIAGNOSIS — H25812 Combined forms of age-related cataract, left eye: Secondary | ICD-10-CM | POA: Diagnosis not present

## 2024-09-20 ENCOUNTER — Other Ambulatory Visit: Payer: Self-pay | Admitting: Family Medicine

## 2024-09-20 DIAGNOSIS — N400 Enlarged prostate without lower urinary tract symptoms: Secondary | ICD-10-CM

## 2024-09-26 DIAGNOSIS — I10 Essential (primary) hypertension: Secondary | ICD-10-CM | POA: Diagnosis not present

## 2024-09-26 DIAGNOSIS — H402212 Chronic angle-closure glaucoma, right eye, moderate stage: Secondary | ICD-10-CM | POA: Diagnosis not present

## 2024-10-21 ENCOUNTER — Ambulatory Visit: Admitting: Family Medicine

## 2024-10-21 ENCOUNTER — Encounter: Payer: Self-pay | Admitting: Family Medicine

## 2024-10-21 VITALS — BP 143/82 | HR 90 | Ht 69.0 in | Wt 178.4 lb

## 2024-10-21 DIAGNOSIS — N400 Enlarged prostate without lower urinary tract symptoms: Secondary | ICD-10-CM

## 2024-10-21 DIAGNOSIS — J302 Other seasonal allergic rhinitis: Secondary | ICD-10-CM

## 2024-10-21 DIAGNOSIS — R972 Elevated prostate specific antigen [PSA]: Secondary | ICD-10-CM | POA: Diagnosis not present

## 2024-10-21 DIAGNOSIS — G4486 Cervicogenic headache: Secondary | ICD-10-CM | POA: Diagnosis not present

## 2024-10-21 DIAGNOSIS — M542 Cervicalgia: Secondary | ICD-10-CM | POA: Diagnosis not present

## 2024-10-21 MED ORDER — CETIRIZINE HCL 10 MG PO TABS: 10.0000 mg | ORAL_TABLET | Freq: Every day | ORAL | 3 refills | Status: AC | PRN

## 2024-10-21 MED ORDER — TAMSULOSIN HCL 0.4 MG PO CAPS: 0.4000 mg | ORAL_CAPSULE | Freq: Every day | ORAL | 3 refills | Status: AC

## 2024-10-21 MED ORDER — AMLODIPINE BESYLATE 5 MG PO TABS: 5.0000 mg | ORAL_TABLET | Freq: Every day | ORAL | 3 refills | Status: AC

## 2024-10-21 NOTE — Assessment & Plan Note (Signed)
 Refilled cetirizine.

## 2024-10-21 NOTE — Progress Notes (Deleted)
   SUBJECTIVE:   CHIEF COMPLAINT / HPI:  Michael Fowler is a 58 y.o. male with a pertinent past medical history of GERD, BPH, and glaucoma presenting to the clinic for headaches.  Headaches Patient reports that ***  Neck pain   Elevated PSA PSA elevated to 7.2 on 9/19, referred to urology. Referral reviewed today shows it is still pending, will message referral coordinator.   PERTINENT PMH / PSH: L eye central retinal vein occlusion and glaucoma s/p cataract surgery BPH GERD  *Remainder reviewed in problem list.   OBJECTIVE:   There were no vitals taken for this visit.  General: Age-appropriate, resting comfortably in chair, NAD, alert and at baseline. HEENT:  Head: Normocephalic, atraumatic. No tenderness to percussion over sinuses. Eyes: PERRLA. No conjunctival erythema or scleral injections. Ears: TMs non-bulging and non-erythematous bilaterally. No erythema of external ear canal. No cerumen impaction. Nose: Non-erythematous turbinates. No rhinorrhea. Mouth/Oral: Clear, no tonsillar exudate. MMM. Neck: Supple. No LAD. Cardiovascular: Regular rate and rhythm. Normal S1/S2. No murmurs, rubs, or gallops appreciated. 2+ radial pulses. Pulmonary: Clear bilaterally to ascultation. No wheezes, crackles, or rhonchi. Normal WOB on room air. No accessory muscle use. Abdominal: No tenderness to deep or light palpation. No rebound or guarding. No HSM. Skin: Warm and dry. Extremities: No peripheral edema bilaterally. Capillary refill <2 seconds.  No results found for this or any previous visit (from the past 48 hours).   ASSESSMENT/PLAN:   Assessment & Plan Nonintractable headache, unspecified chronicity pattern, unspecified headache type  Neck pain  Elevated PSA PSA 7.2, referred to urology previously in September, referral has not been processed yet. - Sent message to referral coordinator to follow-up, patient aware  No follow-ups on file.  [name]  Toma, MD [facility]

## 2024-10-21 NOTE — Progress Notes (Signed)
   SUBJECTIVE:   CHIEF COMPLAINT / HPI:  Michael Fowler is a 58 y.o. male with a pertinent past medical history of GERD, BPH, and glaucoma presenting to the clinic for headaches.  Headaches, cataract surgery Patient had his cataracts surgery October 13th and October 27th. Patient reports that since his eye surgery he is seeing well, but has noticed new headaches. Now using prednisolone eye drops 2-3 times weekly. He reports dry eyes, using artificial tears. Has reported daily headaches, either morning or evening. Feels better when he drinks more water.  Neck pain Has noticed new neck pain along with his headaches. Feels tightness in his neck when he moves. Neck pain is fairly constant. Mild improvement from ibuprofen  400 mg.  Elevated PSA PSA elevated to 7.2 on 9/19, referred to urology. Referral reviewed today shows it is still pending, will message referral coordinator. Trialed finasteride  5 mg on 9/19. Brother in law sent him law palmetto herbal supplement which he thought would help.   PERTINENT PMH / PSH: L eye central retinal vein occlusion and glaucoma s/p cataract surgery in October 2025 BPH GERD  *Remainder reviewed in problem list.   OBJECTIVE:   Blood pressure (!) 143/82, pulse 90, height 5' 9 (1.753 m), weight 178 lb 6.4 oz (80.9 kg), SpO2 100%.  General: Age-appropriate, resting comfortably in chair, NAD, alert and at baseline. HEENT:  Head: Normocephalic, atraumatic. Eyes: PERRLA. No conjunctival erythema or scleral injections. Mouth/Oral: MMM. Cardiovascular: Regular rate and rhythm. Normal S1/S2. No murmurs, rubs, or gallops appreciated. 2+ radial pulses. Pulmonary: Clear bilaterally to ascultation. No wheezes, crackles, or rhonchi. Normal WOB on room air. No accessory muscle use. Abdominal: No tenderness to deep or light palpation. No rebound or guarding. No HSM. Skin: Warm and dry. Extremities: No peripheral edema bilaterally. Capillary  refill <2 seconds. MSK: Full ROM of neck, some pain with full extension and full flexion.  No point tenderness over cervical spines.  Neurological examination: MS:  Awake, alert, interactive. Normal eye contact, answered the questions appropriately, speech was fluent, normal comprehension.  Attention and concentration were normal.  Alert to self, place, month, and situation. Cranial nerves:    CN II:  PERRLA.  Visual fields intact to confrontation.  Blinks normally to threat bilaterally. CNs III, IV, VI:  Full extraocular eye movement without nystagmus.  No ptosis or diplopia. CN V:  Facial sensation is normal, no weakness of masticatory muscles.  CN VII:  No facial weakness or asymmetry.  CN VIII:  Auditory acuity grossly normal. CNs XI/X:  Palate elevation symmetric. CN XI:  Normal sternocleidomastoid and trapezius strength. CN XII: Tongue is midline without atrophy or fasciculations. MOTOR:  Strength 5/5 RUE, 5/5 LUE, 5/5 RLE, 5/5 LLE. COORDINATION:  Intact finger-to-nose, no tremor. SENSATION:  Intact to light touch over all four extremities. GAIT:  Normal walk.   ASSESSMENT/PLAN:   Assessment & Plan Cervicogenic headache Neck pain Suspect headache is cervicogenic.  No red flags.  Self-limited, mild symptoms. - Ibuprofen  and acetaminophen  PRN, limit NSAID use given age - Provided and reviewed cervicogenic pain/neck stretches - Recommended heating pad or warm towel on neck Elevated PSA Patient is doing better reports improved symptoms.  PSA 7.2, referred to urology previously in September, referral has not been processed yet. - Sent message to referral coordinator to follow-up, patient aware Seasonal allergies - Refilled cetirizine  Enlarged prostate - Refilled tamsulosin   Follow up PRN.  Judia Arnott Toma, MD [facility]

## 2024-10-21 NOTE — Assessment & Plan Note (Signed)
Refilled tamsulosin.

## 2024-10-21 NOTE — Patient Instructions (Addendum)
 It was great to see you today! Thank you for choosing [facility] for your primary care.  Today we addressed: 1. Elevated PSA I am messaging our referral coordinator about your prostate follow up.  2. Neck pain 3. Cervicogenic headache Please do the attached stretches.  Do not do any stretches which hurt or feel unsafe.  Stop doing a stretch if something feels wrong. You can also heat (warm towel) over the areas of your neck which hurt. Self-massage is also beneficial. You can sometimes take Tylenol  and ibuprofen  if needed.  You should return to our clinic as needed.  Thank you for coming to see us  at [facility] and for the opportunity to care for you! Toma, Wyeth Hoffer, MD 10/21/2024, 4:42 PM
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
## 2024-11-30 ENCOUNTER — Ambulatory Visit: Admitting: Urology

## 2024-11-30 DIAGNOSIS — R972 Elevated prostate specific antigen [PSA]: Secondary | ICD-10-CM

## 2024-11-30 DIAGNOSIS — N400 Enlarged prostate without lower urinary tract symptoms: Secondary | ICD-10-CM
